# Patient Record
Sex: Male | Born: 1995 | Race: Black or African American | Hispanic: No | Marital: Single | Smoking: Current every day smoker
Health system: Southern US, Community
[De-identification: ages and names within clinical notes are randomized; demographics above are authoritative.]

---
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
## 2014-10-26 ENCOUNTER — Encounter (HOSPITAL_COMMUNITY): Payer: Self-pay | Admitting: *Deleted

## 2014-10-26 ENCOUNTER — Emergency Department (HOSPITAL_COMMUNITY)
Admission: EM | Admit: 2014-10-26 | Discharge: 2014-10-26 | Disposition: A | Discharge status: Home / Self Care | Payer: Medicaid Other | Attending: Emergency Medicine | Admitting: Emergency Medicine

## 2014-10-26 DIAGNOSIS — R300 Dysuria: Secondary | ICD-10-CM | POA: Diagnosis present

## 2014-10-26 DIAGNOSIS — N342 Other urethritis: Secondary | ICD-10-CM | POA: Insufficient documentation

## 2014-10-26 DIAGNOSIS — Z720 Tobacco use: Secondary | ICD-10-CM | POA: Insufficient documentation

## 2014-10-26 LAB — URINALYSIS, ROUTINE W REFLEX MICROSCOPIC
Bilirubin Urine: NEGATIVE
Glucose, UA: 100 mg/dL — AB
HGB URINE DIPSTICK: NEGATIVE
KETONES UR: NEGATIVE mg/dL
Leukocytes, UA: NEGATIVE
Nitrite: POSITIVE — AB
Protein, ur: 30 mg/dL — AB
Specific Gravity, Urine: 1.02 (ref 1.005–1.030)
Urobilinogen, UA: 2 mg/dL — ABNORMAL HIGH (ref 0.0–1.0)
pH: 6.5 (ref 5.0–8.0)

## 2014-10-26 LAB — URINE MICROSCOPIC-ADD ON

## 2014-10-26 MED ORDER — CEFTRIAXONE SODIUM 250 MG IJ SOLR
250.0000 mg | Freq: Once | INTRAMUSCULAR | Status: AC
  Administered 2014-10-26: 250 mg via INTRAMUSCULAR
  Filled 2014-10-26: qty 250

## 2014-10-26 MED ORDER — IBUPROFEN 800 MG PO TABS
800.0000 mg | ORAL_TABLET | Freq: Once | ORAL | Status: AC
  Administered 2014-10-26: 800 mg via ORAL
  Filled 2014-10-26: qty 1

## 2014-10-26 MED ORDER — LIDOCAINE HCL (PF) 1 % IJ SOLN
INTRAMUSCULAR | Status: AC
  Administered 2014-10-26: 5 mL
  Filled 2014-10-26: qty 5

## 2014-10-26 MED ORDER — AZITHROMYCIN 250 MG PO TABS
1000.0000 mg | ORAL_TABLET | Freq: Once | ORAL | Status: AC
  Administered 2014-10-26: 1000 mg via ORAL
  Filled 2014-10-26: qty 4

## 2014-10-26 MED ORDER — AZITHROMYCIN 1 G PO PACK: 1.0000 g | PACK | Freq: Once | ORAL | Status: DC

## 2014-10-26 NOTE — Discharge Instructions (Signed)
He had a culture sent for gonorrhea and chlamydia. He will be contacted if it is positive.  Urethritis Urethritis is an inflammation of the tube through which urine exits your bladder (urethra).  CAUSES Urethritis is often caused by an infection in your urethra. The infection can be viral, like herpes. The infection can also be bacterial, like gonorrhea. RISK FACTORS Risk factors of urethritis include:  Having sex without using a condom.  Having multiple sexual partners.  Having poor hygiene. SIGNS AND SYMPTOMS Symptoms of urethritis are less noticeable in women than in men. These symptoms include:  Burning feeling when you urinate (dysuria).  Discharge from your urethra.  Blood in your urine (hematuria).  Urinating more than usual. DIAGNOSIS  To confirm a diagnosis of urethritis, your health care provider will do the following:  Ask about your sexual history.  Perform a physical exam.  Have you provide a sample of your urine for lab testing.  Use a cotton swab to gently collect a sample from your urethra for lab testing. TREATMENT  It is important to treat urethritis. Depending on the cause, untreated urethritis may lead to serious genital infections and possibly infertility. Urethritis caused by a bacterial infection is treated with antibiotic medicine. All sexual partners must be treated.  HOME CARE INSTRUCTIONS  Do not have sex until the test results are known and treatment is completed, even if your symptoms go away before you finish treatment.  If you were prescribed an antibiotic, finish it all even if you start to feel better. SEEK MEDICAL CARE IF:   Your symptoms are not improved in 3 days.  Your symptoms are getting worse.  You develop abdominal pain or pelvic pain (in women).  You develop joint pain.  You have a fever. SEEK IMMEDIATE MEDICAL CARE IF:   You have severe pain in the belly, back, or side.  You have repeated vomiting. MAKE SURE  YOU:  Understand these instructions.  Will watch your condition.  Will get help right away if you are not doing well or get worse. Document Released: 03/02/2001 Document Revised: 01/21/2014 Document Reviewed: 05/07/2013 [employer] Patient Information 2015 [place] [place]. This information is not intended to replace advice given to you by your health care provider. Make sure you discuss any questions you have with your health care provider.

## 2014-10-26 NOTE — ED Provider Notes (Signed)
CSN: [id number]     Arrival date & time 10/26/14  0132 History   First MD Initiated Contact with Patient 10/26/14 0230     Chief Complaint  Patient presents with  . Dysuria     (Consider location/radiation/quality/duration/timing/severity/associated sxs/prior Treatment) Patient is a 19 y.o. male presenting with dysuria. The history is provided by the patient.  Dysuria  He has noticed burning when he urinates for the last 2 days. There is no urinary urgency, frequency, tenesmus. He denies any unprotected sexual contacts. He denies any urethral discharge. He denies abdominal pain and back pain. There's been no fever or chills and no nausea or vomiting.  History reviewed. No pertinent past medical history. History reviewed. No pertinent past surgical history. History reviewed. No pertinent family history. History  Substance Use Topics  . Smoking status: Current Every Day Smoker -- 1.00 packs/day    Types: Cigarettes  . Smokeless tobacco: Not on file  . Alcohol Use: Yes    Review of Systems  Genitourinary: Positive for dysuria.  All other systems reviewed and are negative.     Allergies  Review of patient's allergies indicates no known allergies.  Home Medications   Prior to Admission medications   Not on File   BP 156/101 mmHg  Pulse 79  Temp(Src) 98.9 F (37.2 C)  Resp 20  Ht 6' (1.829 m)  Wt 170 lb (77.111 kg)  BMI 23.05 kg/m2  SpO2 98% Physical Exam  Nursing note and vitals reviewed.  19 year old male, resting comfortably and in no acute distress. Vital signs are significant for hypertension. Oxygen saturation is 98%, which is normal. Head is normocephalic and atraumatic. PERRLA, EOMI. Oropharynx is clear. Neck is nontender and supple without adenopathy or JVD. Back is nontender and there is no CVA tenderness. Lungs are clear without rales, wheezes, or rhonchi. Chest is nontender. Heart has regular rate and rhythm without murmur. Abdomen is soft, flat,  nontender without masses or hepatosplenomegaly and peristalsis is normoactive. Genitalia: Circumcised penis without urethral discharge. Testes descended without masses. Mild bilateral inguinal adenopathy present. Extremities have no cyanosis or edema, full range of motion is present. Skin is warm and dry without rash. Neurologic: Mental status is normal, cranial nerves are intact, there are no motor or sensory deficits.  ED Course  Procedures (including critical care time) Labs Review Results for orders placed or performed during the hospital encounter of 10/26/14  Urinalysis, Routine w reflex microscopic  Result Value Ref Range   Color, Urine ORANGE (A) YELLOW   APPearance CLEAR CLEAR   Specific Gravity, Urine 1.020 1.005 - 1.030   pH 6.5 5.0 - 8.0   Glucose, UA 100 (A) NEGATIVE mg/dL   Hgb urine dipstick NEGATIVE NEGATIVE   Bilirubin Urine NEGATIVE NEGATIVE   Ketones, ur NEGATIVE NEGATIVE mg/dL   Protein, ur 30 (A) NEGATIVE mg/dL   Urobilinogen, UA 2.0 (H) 0.0 - 1.0 mg/dL   Nitrite POSITIVE (A) NEGATIVE   Leukocytes, UA NEGATIVE NEGATIVE  Urine microscopic-add on  Result Value Ref Range   Squamous Epithelial / LPF RARE RARE   WBC, UA 0-2 <3 WBC/hpf   RBC / HPF 7-10 <3 RBC/hpf   Bacteria, UA RARE RARE     MDM   Final diagnoses:  Urethritis    Urethritis which rapidly represents gonorrhea or chlamydia. He is empirically treated with ceftriaxone and azithromycin. Specimen is been sent for GC and Chlamydia culture. Please note that positive nitrite in his urinalysis is a false-positive due  to color interference from Azo that he is taking.    Dione Booze, MD 10/26/14 [phone]

## 2014-10-26 NOTE — ED Notes (Signed)
Pt reporting burning with urination.  Denies discharge.

## 2014-10-28 LAB — GC/CHLAMYDIA PROBE AMP (~~LOC~~) NOT AT ARMC
Chlamydia: NEGATIVE
Neisseria Gonorrhea: NEGATIVE

## 2014-10-30 ENCOUNTER — Telehealth (HOSPITAL_BASED_OUTPATIENT_CLINIC_OR_DEPARTMENT_OTHER): Payer: Self-pay | Admitting: Emergency Medicine

## 2016-10-31 ENCOUNTER — Encounter (HOSPITAL_COMMUNITY): Payer: Self-pay | Admitting: *Deleted

## 2017-01-10 ENCOUNTER — Ambulatory Visit: Payer: Self-pay | Admitting: Family Medicine

## 2018-09-07 ENCOUNTER — Other Ambulatory Visit: Payer: Self-pay

## 2018-09-07 ENCOUNTER — Encounter (HOSPITAL_COMMUNITY): Payer: Self-pay | Admitting: *Deleted

## 2018-09-07 ENCOUNTER — Emergency Department (HOSPITAL_COMMUNITY)
Admission: EM | Admit: 2018-09-07 | Discharge: 2018-09-08 | Disposition: A | Discharge status: Home / Self Care | Payer: Medicaid Other | Attending: Emergency Medicine | Admitting: Emergency Medicine

## 2018-09-07 DIAGNOSIS — I10 Essential (primary) hypertension: Secondary | ICD-10-CM

## 2018-09-07 DIAGNOSIS — F17210 Nicotine dependence, cigarettes, uncomplicated: Secondary | ICD-10-CM | POA: Insufficient documentation

## 2018-09-07 DIAGNOSIS — Z79899 Other long term (current) drug therapy: Secondary | ICD-10-CM | POA: Diagnosis not present

## 2018-09-07 DIAGNOSIS — R030 Elevated blood-pressure reading, without diagnosis of hypertension: Secondary | ICD-10-CM | POA: Diagnosis not present

## 2018-09-07 DIAGNOSIS — R42 Dizziness and giddiness: Secondary | ICD-10-CM | POA: Diagnosis present

## 2018-09-07 MED ORDER — MECLIZINE HCL 12.5 MG PO TABS
25.0000 mg | ORAL_TABLET | Freq: Once | ORAL | Status: AC
  Administered 2018-09-07: 25 mg via ORAL
  Filled 2018-09-07: qty 2

## 2018-09-07 MED ORDER — SODIUM CHLORIDE 0.9 % IV BOLUS
1000.0000 mL | Freq: Once | INTRAVENOUS | Status: AC
  Administered 2018-09-07: 1000 mL via INTRAVENOUS

## 2018-09-07 NOTE — ED Triage Notes (Signed)
Pt c/o dizziness that started today, chills for the past few days, denies any n/v

## 2018-09-08 LAB — BASIC METABOLIC PANEL
Anion gap: 10 (ref 5–15)
BUN: 15 mg/dL (ref 6–20)
CHLORIDE: 103 mmol/L (ref 98–111)
CO2: 24 mmol/L (ref 22–32)
Calcium: 9.1 mg/dL (ref 8.9–10.3)
Creatinine, Ser: 0.79 mg/dL (ref 0.61–1.24)
GFR calc Af Amer: >60 mL/min (ref >60)
GFR calc non Af Amer: >60 mL/min (ref >60)
Glucose, Bld: 112 mg/dL — ABNORMAL HIGH (ref 70–99)
Potassium: 3.4 mmol/L — ABNORMAL LOW (ref 3.5–5.1)
SODIUM: 137 mmol/L (ref 135–145)

## 2018-09-08 LAB — CBC WITH DIFFERENTIAL/PLATELET
Abs Immature Granulocytes: 0.11 10*3/uL — ABNORMAL HIGH (ref 0.00–0.07)
Basophils Absolute: 0 10*3/uL (ref 0.0–0.1)
Basophils Relative: 0 %
Eosinophils Absolute: 0.1 10*3/uL (ref 0.0–0.5)
Eosinophils Relative: 1 %
HEMATOCRIT: 42.6 % (ref 39.0–52.0)
HEMOGLOBIN: 14.2 g/dL (ref 13.0–17.0)
Immature Granulocytes: 1 %
LYMPHS PCT: 23 %
Lymphs Abs: 2.4 10*3/uL (ref 0.7–4.0)
MCH: 30.7 pg (ref 26.0–34.0)
MCHC: 33.3 g/dL (ref 30.0–36.0)
MCV: 92.2 fL (ref 80.0–100.0)
Monocytes Absolute: 0.8 10*3/uL (ref 0.1–1.0)
Monocytes Relative: 7 %
Neutro Abs: 7.1 10*3/uL (ref 1.7–7.7)
Neutrophils Relative %: 68 %
Platelets: 399 10*3/uL (ref 150–400)
RBC: 4.62 MIL/uL (ref 4.22–5.81)
RDW: 13.1 % (ref 11.5–15.5)
WBC: 10.5 10*3/uL (ref 4.0–10.5)
nRBC: 0 % (ref 0.0–0.2)

## 2018-09-08 LAB — URINALYSIS, ROUTINE W REFLEX MICROSCOPIC
BILIRUBIN URINE: NEGATIVE
Glucose, UA: NEGATIVE mg/dL
HGB URINE DIPSTICK: NEGATIVE
Ketones, ur: NEGATIVE mg/dL
Leukocytes, UA: NEGATIVE
Nitrite: NEGATIVE
PH: 6 (ref 5.0–8.0)
Protein, ur: NEGATIVE mg/dL
SPECIFIC GRAVITY, URINE: 1.019 (ref 1.005–1.030)

## 2018-09-08 MED ORDER — MECLIZINE HCL 25 MG PO TABS: 25.0000 mg | ORAL_TABLET | Freq: Three times a day (TID) | ORAL | 0 refills | Status: DC | PRN

## 2018-09-08 NOTE — ED Provider Notes (Signed)
[facility] Provider Note   CSN: [id number] Arrival date & time: 09/07/18  2246     History   Chief Complaint Chief Complaint  Patient presents with  . Dizziness    HPI Bradley Wilcox is a 22 y.o. male.  HPI   Bradley Wilcox is a 22 y.o. male who presents to the Emergency Department complaining of sinus pressure, nasal congestion and occasional cough.  Symptoms began 1 week ago and seem to be improving.  Today, he states he has noticed dizziness upon standing and intermittent sweats and chills for several days.  He denies known fever.  He has not taken any medication for symptomatic relief.  He states the dizziness only occurs with sudden position change.  He denies vomiting, nausea, chest pain, shortness of breath and headache.  No new medications and he denies drug use   History reviewed. No pertinent past medical history.  Patient Active Problem List   Diagnosis Date Noted  . Acute respiratory failure with hypoxia (HCC) 03/25/2013  . Alcohol intoxication (HCC) 03/25/2013  . Reactive airway disease 03/25/2013    History reviewed. No pertinent surgical history.    Home Medications    Prior to Admission medications   Medication Sig Start Date End Date Taking? Authorizing Provider  lisdexamfetamine (VYVANSE) 20 MG capsule Take 20 mg by mouth every morning.    [provider]    Family History Family History  Family history unknown: Yes    Social History Social History   Tobacco Use  . Smoking status: Current Every Day Smoker    Packs/day: 1.00    Types: Cigarettes  . Smokeless tobacco: Never Used  Substance Use Topics  . Alcohol use: Yes  . Drug use: Never     Allergies   Patient has no known allergies.   Review of Systems Review of Systems  Constitutional: Positive for chills. Negative for activity change and appetite change.  HENT: Positive for congestion, sinus pressure and sinus pain. Negative for sore throat and  trouble swallowing.   Respiratory: Positive for cough. Negative for chest tightness and shortness of breath.   Cardiovascular: Negative for chest pain and palpitations.  Gastrointestinal: Negative for abdominal pain, diarrhea, nausea and vomiting.  Genitourinary: Negative for dysuria.  Musculoskeletal: Negative for arthralgias, myalgias, neck pain and neck stiffness.  Skin: Negative for rash.  Neurological: Positive for dizziness. Negative for syncope, facial asymmetry, weakness, numbness and headaches.  Psychiatric/Behavioral: Negative for confusion.     Physical Exam Updated Vital Signs BP (!) 176/97   Pulse (!) 108   Temp 98.5 F (36.9 C) (Oral)   Resp 20   Ht 6' (1.829 m)   Wt 104.3 kg   SpO2 98%   BMI 31.19 kg/m   Physical Exam Vitals signs and nursing note reviewed.  Constitutional:      Appearance: He is not ill-appearing or toxic-appearing.  HENT:     Head: Atraumatic.     Right Ear: Tympanic membrane and ear canal normal. No middle ear effusion.     Left Ear: Tympanic membrane and ear canal normal.  No middle ear effusion.     Mouth/Throat:     Mouth: Mucous membranes are moist.     Pharynx: Oropharynx is clear. No oropharyngeal exudate.  Eyes:     Conjunctiva/sclera: Conjunctivae normal.     Pupils: Pupils are equal, round, and reactive to light.  Neck:     Musculoskeletal: Normal range of motion. No neck rigidity.  Trachea: Phonation normal.     Meningeal: Kernig's sign absent.  Cardiovascular:     Rate and Rhythm: Normal rate and regular rhythm.     Pulses: Normal pulses.  Pulmonary:     Effort: Pulmonary effort is normal. No respiratory distress.     Breath sounds: Normal breath sounds.  Abdominal:     Palpations: Abdomen is soft.     Tenderness: There is no abdominal tenderness.  Musculoskeletal: Normal range of motion.  Skin:    General: Skin is warm.     Capillary Refill: Capillary refill takes less than 2 seconds.     Findings: No rash.    Neurological:     General: No focal deficit present.     Mental Status: He is alert.     GCS: GCS eye subscore is 4. GCS verbal subscore is 5. GCS motor subscore is 6.     Sensory: Sensation is intact. No sensory deficit.     Motor: Motor function is intact.     Coordination: Finger-Nose-Finger Test and Heel to [place] Test normal.     Comments: CN II-XII intact.  Speech clear, no pronator drift.   Psychiatric:        Mood and Affect: Mood normal.      ED Treatments / Results  Labs (all labs ordered are listed, but only abnormal results are displayed) Labs Reviewed  CBC WITH DIFFERENTIAL/PLATELET - Abnormal; Notable for the following components:      Result Value   Abs Immature Granulocytes 0.11 (*)    All other components within normal limits  BASIC METABOLIC PANEL  URINALYSIS, ROUTINE W REFLEX MICROSCOPIC    EKG None  Radiology No results found.  Procedures Procedures (including critical care time)  Medications Ordered in ED Medications  sodium chloride 0.9 % bolus 1,000 mL (1,000 mLs Intravenous New Bag/Given 09/07/18 2357)  meclizine (ANTIVERT) tablet 25 mg (25 mg Oral Given 09/07/18 2357)     Initial Impression / Assessment and Plan / ED Course  I have reviewed the triage vital signs and the nursing notes.  Pertinent labs & imaging results that were available during my care of the patient were reviewed by me and considered in my medical decision making (see chart for details).      Orthostatic VS for the past 24 hrs:  BP- Lying Pulse- Lying BP- Sitting Pulse- Sitting BP- Standing at 0 minutes Pulse- Standing at 0 minutes  09/08/18 0002 143/89 86 (!) 156/102 93 (!) 144/94 93      Patient is well-appearing.  Patient is hypertensive without known history.  No focal neuro deficits on exam, no headache or nuchal rigidity. Onset of dizziness after sinus pressure and nasal congestion.  Current symptoms felt to be related to this, although I have discussed the  issue of his elevated blood pressure and the importance of follow-up.  He agrees to treatment plan of his symptoms and close outpatient follow-up for his blood pressure.  Return precautions discussed.  Final Clinical Impressions(s) / ED Diagnoses   Final diagnoses:  Dizziness  Hypertension, unspecified type    ED Discharge Orders    None       Pauline Ausriplett, Vito Beg, PA-C 09/08/18 0126    Devoria AlbeKnapp, Iva, MD 09/08/18 [phone]

## 2018-09-08 NOTE — Discharge Instructions (Addendum)
As discussed, your blood pressure this evening is elevated.  This will need to be rechecked soon.  You may contact the clinic listed to establish care and to have your blood pressure rechecked.  Return to the emergency room for any worsening symptoms such as chest pain shortness of breath, or severe headache.  [facility] Doctor List    Kari BaarsEdward Hawkins MD. Specialty: Pulmonary Disease Contact information: [address]  [address]  [place] [address]  [phone]   Syliva OvermanMargaret Simpson, MD. Specialty: [facility] Contact information: [address]  [place] [address]  [phone]   Lilyan PuntScott Luking, MD. Specialty: [facility] Contact information: [address]  [place] [address]  [phone]   Avon Gullyesfaye Fanta, MD Specialty: Internal Medicine Contact information: [address]  [place] [address]  [phone]   Catalina PizzaZach Hall, MD. Specialty: Internal Medicine Contact information: [address]  [place] [address]  [phone]    [facility] (Dr. Selena BattenKim) Specialty: Family Medicine Contact information: [address]  [place] [address]  [phone]   John GiovanniStephen Knowlton, MD. Specialty: [facility] Contact information: [address]  [address]  [place] [address]  [phone]   Carylon Perchesoy Fagan, MD. Specialty: Internal Medicine Contact information: [address]  [address]  [place] [address]  [phone]    [facility]  [address] [place], [place] [address] [phone]  Services [facility] offers a variety of basic health services.  Services include but are not limited to: Blood pressure checks  Heart rate checks  Blood sugar checks  Urine analysis  Rapid strep tests  Pregnancy tests.  Health education and referrals  People needing more complex services will be directed to a physician online. Using  these virtual visits, doctors can evaluate and prescribe medicine and treatments. There will be no medication on-site, though [facility] will help patients fill their prescriptions at little to no cost.   For More information please go to: [email or web address]

## 2019-02-21 ENCOUNTER — Encounter (HOSPITAL_COMMUNITY): Payer: Self-pay | Admitting: Emergency Medicine

## 2019-02-21 ENCOUNTER — Emergency Department (HOSPITAL_COMMUNITY)
Admission: EM | Admit: 2019-02-21 | Discharge: 2019-02-21 | Disposition: A | Discharge status: Home / Self Care | Payer: Medicaid Other | Attending: Emergency Medicine | Admitting: Emergency Medicine

## 2019-02-21 ENCOUNTER — Other Ambulatory Visit: Payer: Self-pay

## 2019-02-21 DIAGNOSIS — R1310 Dysphagia, unspecified: Secondary | ICD-10-CM | POA: Insufficient documentation

## 2019-02-21 DIAGNOSIS — F17210 Nicotine dependence, cigarettes, uncomplicated: Secondary | ICD-10-CM | POA: Insufficient documentation

## 2019-02-21 LAB — BASIC METABOLIC PANEL
Anion gap: 13 (ref 5–15)
BUN: 13 mg/dL (ref 6–20)
CO2: 24 mmol/L (ref 22–32)
Calcium: 9.2 mg/dL (ref 8.9–10.3)
Chloride: 102 mmol/L (ref 98–111)
Creatinine, Ser: 1 mg/dL (ref 0.61–1.24)
GFR calc Af Amer: >60 mL/min (ref >60)
GFR calc non Af Amer: >60 mL/min (ref >60)
Glucose, Bld: 117 mg/dL — ABNORMAL HIGH (ref 70–99)
Potassium: 3.6 mmol/L (ref 3.5–5.1)
Sodium: 139 mmol/L (ref 135–145)

## 2019-02-21 LAB — CBC
HCT: 44.4 % (ref 39.0–52.0)
Hemoglobin: 14.8 g/dL (ref 13.0–17.0)
MCH: 30.1 pg (ref 26.0–34.0)
MCHC: 33.3 g/dL (ref 30.0–36.0)
MCV: 90.4 fL (ref 80.0–100.0)
Platelets: 375 10*3/uL (ref 150–400)
RBC: 4.91 MIL/uL (ref 4.22–5.81)
RDW: 13.8 % (ref 11.5–15.5)
WBC: 6 10*3/uL (ref 4.0–10.5)
nRBC: 0 % (ref 0.0–0.2)

## 2019-02-21 MED ORDER — SODIUM CHLORIDE 0.9 % IV BOLUS
1000.0000 mL | Freq: Once | INTRAVENOUS | Status: AC
  Administered 2019-02-21: 1000 mL via INTRAVENOUS

## 2019-02-21 MED ORDER — LIDOCAINE VISCOUS HCL 2 % MT SOLN
15.0000 mL | Freq: Once | OROMUCOSAL | Status: AC
Start: 2019-02-21 — End: 2019-02-21
  Administered 2019-02-21: 15 mL via ORAL
  Filled 2019-02-21: qty 15

## 2019-02-21 MED ORDER — DIPHENHYDRAMINE HCL 50 MG/ML IJ SOLN
25.0000 mg | Freq: Once | INTRAMUSCULAR | Status: AC
  Administered 2019-02-21: 25 mg via INTRAVENOUS
  Filled 2019-02-21: qty 1

## 2019-02-21 MED ORDER — PANTOPRAZOLE SODIUM 20 MG PO TBEC: 20.0000 mg | DELAYED_RELEASE_TABLET | Freq: Every day | ORAL | 0 refills | Status: DC

## 2019-02-21 MED ORDER — FAMOTIDINE IN NACL 20-0.9 MG/50ML-% IV SOLN
20.0000 mg | Freq: Once | INTRAVENOUS | Status: AC
  Administered 2019-02-21: 20 mg via INTRAVENOUS
  Filled 2019-02-21: qty 50

## 2019-02-21 MED ORDER — ALUM & MAG HYDROXIDE-SIMETH 200-200-20 MG/5ML PO SUSP
30.0000 mL | Freq: Once | ORAL | Status: AC
  Administered 2019-02-21: 21:00:00 30 mL via ORAL
  Filled 2019-02-21: qty 30

## 2019-02-21 NOTE — ED Provider Notes (Signed)
[facility] Provider Note   CSN: [id number] Arrival date & time: 02/21/19  1830    History   Chief Complaint Chief Complaint  Patient presents with  . Dysphagia    HPI Bradley Wilcox is a 23 y.o. male presenting for evaluation of dysphagia.  Patient states approximately 1 hour prior to arrival he had acute onset feeling like his mouth and jaw were looking up and he was having difficulty swallowing.  He denies difficulty breathing, nausea, vomiting, chest pain, shortness of breath, pruritus, or rash.  He denies recent fevers, chills, sore throat, or dental pain.  He states he ate a sausage biscuit around noon today, has had nothing else to eat or drink.  He has not taken any medication today, he has not recently been started on any medicine.  He denies new exposures including soaps, shampoos, facial wash. He denies new exposures at work.  He denies recent fevers, chills, cough, abdominal pain.  Patient states he feels slightly dizzy when going from sitting to standing, had some neck similar happen several months ago resolved without intervention.  No dizziness at rest or currently.      HPI  History reviewed. No pertinent past medical history.  Patient Active Problem List   Diagnosis Date Noted  . Acute respiratory failure with hypoxia (HCC) 03/25/2013  . Alcohol intoxication (HCC) 03/25/2013  . Reactive airway disease 03/25/2013    History reviewed. No pertinent surgical history.      Home Medications    Prior to Admission medications   Medication Sig Start Date End Date Taking? Authorizing Provider  pantoprazole (PROTONIX) 20 MG tablet Take 1 tablet (20 mg total) by mouth daily for 14 days. 02/21/19 03/07/19  Yvan Dority, PA-C    Family History Family History  Family history unknown: Yes    Social History Social History   Tobacco Use  . Smoking status: Current Every Day Smoker    Packs/day: 1.00    Types: Cigarettes  . Smokeless  tobacco: Never Used  Substance Use Topics  . Alcohol use: Yes  . Drug use: Never     Allergies   Patient has no known allergies.   Review of Systems Review of Systems  HENT:       Dysphagia  Neurological: Positive for dizziness (intermittent).  All other systems reviewed and are negative.    Physical Exam Updated Vital Signs BP (!) 132/94   Pulse 92   Temp 99.9 F (37.7 C) (Oral)   Resp 18   Ht 6' (1.829 m)   Wt 99.8 kg   SpO2 99%   BMI 29.84 kg/m   Physical Exam Vitals signs and nursing note reviewed.  Constitutional:      General: He is not in acute distress.    Appearance: He is well-developed.  HENT:     Head: Normocephalic and atraumatic.     Comments: No obvious facial swelling, although patient has a large beard making evaluation difficult.  No obvious trismus or dental pain.  No lip, tongue, or throat swelling.  Handling secretions easily.  Airway intact. No pain under the tongue.  No muffled voice.  Uvula midline with equal palate rise.  No tonsillar swelling or erythema. Eyes:     Extraocular Movements: Extraocular movements intact.     Conjunctiva/sclera: Conjunctivae normal.     Pupils: Pupils are equal, round, and reactive to light.  Neck:     Musculoskeletal: Normal range of motion and neck supple.  Cardiovascular:  Rate and Rhythm: Normal rate and regular rhythm.     Pulses: Normal pulses.  Pulmonary:     Effort: Pulmonary effort is normal. No respiratory distress.     Breath sounds: Normal breath sounds. No wheezing.     Comments: Speaking in full sentences, clear lung sounds in all fields.  Abdominal:     General: Bowel sounds are normal. There is no distension.     Palpations: Abdomen is soft.     Tenderness: There is no abdominal tenderness.  Musculoskeletal: Normal range of motion.  Skin:    General: Skin is warm and dry.  Neurological:     Mental Status: He is alert and oriented to person, place, and time.      ED Treatments /  Results  Labs (all labs ordered are listed, but only abnormal results are displayed) Labs Reviewed  BASIC METABOLIC PANEL - Abnormal; Notable for the following components:      Result Value   Glucose, Bld 117 (*)    All other components within normal limits  CBC    EKG None  Radiology No results found.  Procedures Procedures (including critical care time)  Medications Ordered in ED Medications  diphenhydrAMINE (BENADRYL) injection 25 mg (25 mg Intravenous Given 02/21/19 1912)  famotidine (PEPCID) IVPB 20 mg premix (0 mg Intravenous Stopped 02/21/19 1959)  sodium chloride 0.9 % bolus 1,000 mL (0 mLs Intravenous Stopped 02/21/19 2051)  alum & mag hydroxide-simeth (MAALOX/MYLANTA) 200-200-20 MG/5ML suspension 30 mL (30 mLs Oral Given 02/21/19 2052)    And  lidocaine (XYLOCAINE) 2 % viscous mouth solution 15 mL (15 mLs Oral Given 02/21/19 2052)     Initial Impression / Assessment and Plan / ED Course  I have reviewed the triage vital signs and the nursing notes.  Pertinent labs & imaging results that were available during my care of the patient were reviewed by me and considered in my medical decision making (see chart for details).        Patient resenting for evaluation of dysphagia.  Physical exam reassuring, he appears nontoxic.  Airways intact.  No signs of anaphylaxis or respiratory distress.  As patient reports a locking sensation, consider possible dystonic reaction, although no known trigger.  As such, will give IV Benadryl.  Will give Pepcid for possible GERD causing dysphagia.  On reassessment, patient report symptoms are improved with Benadryl and Pepcid.  Drinking water without difficulty.  Patient reports continued dizziness when going from sitting to standing, will order orthostatics, basic labs, give a bolus of fluids and reassess.  Orthostatics show elevation of heart rate will go from sitting to standing, but blood pressure remained stable.  RN reports patient reports  increased dysphagia upon standing, but no significant dizziness. Discussed with attending, Dr. Jacqulyn Bath agrees to plan.  As symptoms resolve when patient lays down, low suspicion for emergent cause for patient's dysphasia such as mass, infection, abscess, anaphylaxis.  Likely GERD.  Symptoms improved with viscous lidocaine.  Discussed with patient.  Discussed treatment with daily PPI and follow-up with either primary care and/or GI.  At this time, patient appears safe for discharge.  Return precautions given.  Patient states he understands agrees with plan.   Final Clinical Impressions(s) / ED Diagnoses   Final diagnoses:  Dysphagia, unspecified type    ED Discharge Orders         Ordered    pantoprazole (PROTONIX) 20 MG tablet  Daily     02/21/19 2115  Alveria Apley, PA-C 02/21/19 2210    Maia Plan, MD 02/22/19 1233

## 2019-02-21 NOTE — Discharge Instructions (Signed)
Take Protonix daily. If you are feeling something stuck in throat, you may try Tums or Maalox. Follow-up with your primary care doctor.  There is information listed below, as well as in the paperwork. You may also schedule appointment with GI stomach doctors for further evaluation. Make sure you stay well-hydrated water. Return to the emergency room if you develop difficulty breathing, swelling of your lips/tongue/throat, inability to swallow, or any new, worsening, concerning symptoms.

## 2019-02-21 NOTE — ED Notes (Signed)
Patient states that upon standing he felt weak and dizzy at this time, states also that his throat feels like it is swelling again.

## 2019-02-21 NOTE — ED Triage Notes (Signed)
Started having difficulty swallowing about one hour ago.  C/o dizziness denies pain.  Pt reports eating a sausage biscuit at 1200.

## 2019-02-22 ENCOUNTER — Encounter (HOSPITAL_COMMUNITY): Payer: Self-pay | Admitting: *Deleted

## 2019-02-22 ENCOUNTER — Emergency Department (HOSPITAL_COMMUNITY)
Admission: EM | Admit: 2019-02-22 | Discharge: 2019-02-22 | Disposition: A | Discharge status: Home / Self Care | Payer: Medicaid Other | Attending: Emergency Medicine | Admitting: Emergency Medicine

## 2019-02-22 DIAGNOSIS — R1310 Dysphagia, unspecified: Secondary | ICD-10-CM | POA: Diagnosis present

## 2019-02-22 DIAGNOSIS — R070 Pain in throat: Secondary | ICD-10-CM | POA: Insufficient documentation

## 2019-02-22 DIAGNOSIS — F17210 Nicotine dependence, cigarettes, uncomplicated: Secondary | ICD-10-CM | POA: Insufficient documentation

## 2019-02-22 MED ORDER — LIDOCAINE VISCOUS HCL 2 % MT SOLN
15.0000 mL | Freq: Once | OROMUCOSAL | Status: AC
  Administered 2019-02-22: 15 mL via ORAL
  Filled 2019-02-22: qty 15

## 2019-02-22 MED ORDER — PANTOPRAZOLE SODIUM 20 MG PO TBEC: 20.0000 mg | DELAYED_RELEASE_TABLET | Freq: Once | ORAL | Status: DC

## 2019-02-22 MED ORDER — ALUM & MAG HYDROXIDE-SIMETH 200-200-20 MG/5ML PO SUSP
30.0000 mL | Freq: Once | ORAL | Status: AC
  Administered 2019-02-22: 30 mL via ORAL
  Filled 2019-02-22: qty 30

## 2019-02-22 NOTE — ED Triage Notes (Signed)
Pt in c/o difficulty swallowing and intermittent throat pain since last night, denies cough or fever, reports its intermittent and his throat will feel tight, no distress noted

## 2019-02-22 NOTE — Discharge Instructions (Addendum)
Take the Protonix that was prescribed to you once daily at least 30 minutes before breakfast every day, for acute symptoms you can use Maalox and Chloraseptic throat spray or cervical throat lozenges to help with the odd sensation in your throat.  If the symptoms continue you will need to follow-up with your primary care doctor or a GI doctor for further evaluation.  If you develop fevers, facial swelling or swelling in your neck, high-pitched noise when he breathes in and out, difficulty breathing or swallowing where you are unable to tolerate your saliva or swallow food and drink please return to the emergency department.  COVID Testing: [phone], call this number for the [facility] department for prescreening and potentially schedule appointment for COVID testing

## 2019-02-22 NOTE — ED Provider Notes (Signed)
[facility] EMERGENCY DEPARTMENT Provider Note   CSN: [id number] Arrival date & time: 02/22/19  1321    History   Chief Complaint Chief Complaint  Patient presents with  . Sore Throat    HPI Bradley Wilcox is a 23 y.o. male.     Bradley Wilcox is a 23 y.o. male who presents for evaluation of dysphagia. Patient reports symptoms started yesterday, patient was initially evaluated at [facility] emergency department 1 hour after symptoms started, he had reassuring evaluation and was discharged home PPI, felt that GERD may be causing symptoms.  Patient reports that he feels like his jaw and mouth get tight suddenly and has difficulty swallowing.  He reports he is unsure what triggers the symptoms, and it hurt for the first time yesterday.  No reports his throat does not feel sore but it almost feels swollen the back occultly bleeding.  He has not had any fevers, chills or cough.  He has been able to eat food and swallow successfully yesterday and today.  He has not had any nausea or vomiting he denies abdominal pain, chest pain or shortness of breath.  He was prescribed Protonix yesterday after he was given a GI with improvement in the emergency department, he reports he has not started taking this medication yet as he felt that it would not work given problem seems to be in his throat and this medicine is for stomach.     History reviewed. No pertinent past medical history.  Patient Active Problem List   Diagnosis Date Noted  . Acute respiratory failure with hypoxia (HCC) 03/25/2013  . Alcohol intoxication (HCC) 03/25/2013  . Reactive airway disease 03/25/2013    History reviewed. No pertinent surgical history.      Home Medications    Prior to Admission medications   Medication Sig Start Date End Date Taking? Authorizing Provider  pantoprazole (PROTONIX) 20 MG tablet Take 1 tablet (20 mg total) by mouth daily for 14 days. 02/21/19 03/07/19  Caccavale, Sophia,  PA-C    Family History Family History  Family history unknown: Yes    Social History Social History   Tobacco Use  . Smoking status: Current Every Day Smoker    Packs/day: 1.00    Types: Cigarettes  . Smokeless tobacco: Never Used  Substance Use Topics  . Alcohol use: Yes  . Drug use: Never     Allergies   Patient has no known allergies.   Review of Systems Review of Systems  Constitutional: Negative for chills and fever.  HENT: Positive for sore throat and trouble swallowing. Negative for facial swelling.   Eyes: Negative for visual disturbance.  Respiratory: Negative for cough and shortness of breath.   Cardiovascular: Negative for chest pain.  Gastrointestinal: Negative for abdominal pain, nausea and vomiting.  Musculoskeletal: Negative for neck pain and neck stiffness.  Skin: Negative for rash.  Neurological: Negative for headaches.     Physical Exam Updated Vital Signs BP (!) 172/116   Pulse 100   Temp 98.1 F (36.7 C) (Oral)   Resp 16   SpO2 100%   Physical Exam Vitals signs and nursing note reviewed.  Constitutional:      General: He is not in acute distress.    Appearance: He is well-developed and normal weight. He is not diaphoretic.     Comments: Well-appearing and in no acute distress  HENT:     Head: Normocephalic and atraumatic.     Mouth/Throat:  Mouth: Mucous membranes are moist.     Pharynx: Oropharynx is clear. Uvula midline. No pharyngeal swelling, oropharyngeal exudate, posterior oropharyngeal erythema or uvula swelling.     Tonsils: No tonsillar exudate. 0 on the right. 0 on the left.     Comments: Posterior oropharynx clear and mucous membranes moist, there is no erythema, edema or tonsillar exudates, uvula midline, normal phonation, no trismus, tolerating secretions without difficulty. Eyes:     General:        Right eye: No discharge.        Left eye: No discharge.  Neck:     Musculoskeletal: Normal range of motion and neck  supple.     Comments: Neck is supple and NTTP, no palpable masses, no stridor, no rigidity, no lymphadenopathy. Cardiovascular:     Rate and Rhythm: Normal rate and regular rhythm.     Heart sounds: Normal heart sounds. No murmur. No friction rub. No gallop.   Pulmonary:     Effort: Pulmonary effort is normal. No respiratory distress.     Breath sounds: Normal breath sounds.     Comments: Respirations equal and unlabored, patient able to speak in full sentences, lungs clear to auscultation bilaterally Abdominal:     General: Bowel sounds are normal. There is no distension.     Palpations: Abdomen is soft.     Tenderness: There is no abdominal tenderness.     Comments: Abdomen soft, nondistended, nontender to palpation in all quadrants without guarding or peritoneal signs  Lymphadenopathy:     Cervical: No cervical adenopathy.  Skin:    General: Skin is warm and dry.  Neurological:     Mental Status: He is alert.     Coordination: Coordination normal.  Psychiatric:        Mood and Affect: Mood normal.        Behavior: Behavior normal.      ED Treatments / Results  Labs (all labs ordered are listed, but only abnormal results are displayed) Labs Reviewed - No data to display  EKG None  Radiology No results found.  Procedures Procedures (including critical care time)  Medications Ordered in ED Medications  pantoprazole (PROTONIX) EC tablet 20 mg (has no administration in time range)  alum & mag hydroxide-simeth (MAALOX/MYLANTA) 200-200-20 MG/5ML suspension 30 mL (30 mLs Oral Given 02/22/19 1415)    And  lidocaine (XYLOCAINE) 2 % viscous mouth solution 15 mL (15 mLs Oral Given 02/22/19 1415)     Initial Impression / Assessment and Plan / ED Course  I have reviewed the triage vital signs and the nursing notes.  Pertinent labs & imaging results that were available during my care of the patient were reviewed by me and considered in my medical decision making (see chart for  details).  Patient presents to the emergency department for difficulty feeling and tightness, with evaluated for the same yesterday with reassuring work-up.  He reports symptoms came back last night, they had initially improved after GI cocktail yesterday and symptoms were thought to be caused by acid reflux.  On my exam patient has normal vitals aside from elevated blood pressure and he is well-appearing and in no acute distress he is not stridorous, tolerating secretions without difficulty, no trismus.  Posterior oropharynx is clear there is no evidence of swelling and no swelling or masses palpable in the neck, I have extremely low suspicion for infectious cause such as abscess, or mass as is her present symptoms I do think this could  certainly be related to GERD.  He was once again given a GI cocktail with improvement in his symptoms.  Have a discussion with patient about using PPI to help reduce acid reflux up into the esophagus which is likely causing esophageal spasm and sensation.  Patient then asks about coronavirus testing and I wonder if this is contributing to his anxiety with his symptoms, feel that his symptoms are suggestive of coronavirus, he is not a candidate for testing here at the hospital today provided resources for outpatient testing center.  He expresses understanding and agreement with plan.  Discharged home in good condition.  Final Clinical Impressions(s) / ED Diagnoses   Final diagnoses:  Dysphagia, unspecified type    ED Discharge Orders    None       Legrand Rams 02/23/19 2129    Melene Plan, DO 02/24/19 [phone]

## 2019-11-19 ENCOUNTER — Emergency Department (HOSPITAL_COMMUNITY): Payer: Medicaid Other

## 2019-11-19 ENCOUNTER — Encounter (HOSPITAL_COMMUNITY): Payer: Self-pay | Admitting: Emergency Medicine

## 2019-11-19 ENCOUNTER — Other Ambulatory Visit: Payer: Self-pay

## 2019-11-19 ENCOUNTER — Emergency Department (HOSPITAL_COMMUNITY)
Admission: EM | Admit: 2019-11-19 | Discharge: 2019-11-19 | Disposition: A | Discharge status: Home / Self Care | Payer: Medicaid Other | Attending: Emergency Medicine | Admitting: Emergency Medicine

## 2019-11-19 DIAGNOSIS — T148XXA Other injury of unspecified body region, initial encounter: Secondary | ICD-10-CM | POA: Insufficient documentation

## 2019-11-19 DIAGNOSIS — F17210 Nicotine dependence, cigarettes, uncomplicated: Secondary | ICD-10-CM | POA: Diagnosis not present

## 2019-11-19 DIAGNOSIS — Y9389 Activity, other specified: Secondary | ICD-10-CM | POA: Diagnosis not present

## 2019-11-19 DIAGNOSIS — Z79899 Other long term (current) drug therapy: Secondary | ICD-10-CM | POA: Diagnosis not present

## 2019-11-19 DIAGNOSIS — Z23 Encounter for immunization: Secondary | ICD-10-CM | POA: Insufficient documentation

## 2019-11-19 DIAGNOSIS — T07XXXA Unspecified multiple injuries, initial encounter: Secondary | ICD-10-CM

## 2019-11-19 DIAGNOSIS — S0990XA Unspecified injury of head, initial encounter: Secondary | ICD-10-CM | POA: Diagnosis present

## 2019-11-19 DIAGNOSIS — Y999 Unspecified external cause status: Secondary | ICD-10-CM | POA: Insufficient documentation

## 2019-11-19 DIAGNOSIS — S0181XA Laceration without foreign body of other part of head, initial encounter: Secondary | ICD-10-CM | POA: Insufficient documentation

## 2019-11-19 DIAGNOSIS — S93402A Sprain of unspecified ligament of left ankle, initial encounter: Secondary | ICD-10-CM | POA: Diagnosis not present

## 2019-11-19 DIAGNOSIS — Y9289 Other specified places as the place of occurrence of the external cause: Secondary | ICD-10-CM | POA: Insufficient documentation

## 2019-11-19 MED ORDER — LIDOCAINE-EPINEPHRINE (PF) 1 %-1:200000 IJ SOLN
10.0000 mL | Freq: Once | INTRAMUSCULAR | Status: AC
  Administered 2019-11-19: 10 mL
  Filled 2019-11-19: qty 30

## 2019-11-19 MED ORDER — IBUPROFEN 800 MG PO TABS
800.0000 mg | ORAL_TABLET | Freq: Once | ORAL | Status: AC
  Administered 2019-11-19: 02:00:00 800 mg via ORAL
  Filled 2019-11-19: qty 1

## 2019-11-19 MED ORDER — OXYCODONE-ACETAMINOPHEN 5-325 MG PO TABS
1.0000 | ORAL_TABLET | Freq: Once | ORAL | Status: AC
  Administered 2019-11-19: 1 via ORAL
  Filled 2019-11-19: qty 1

## 2019-11-19 MED ORDER — TETANUS-DIPHTH-ACELL PERTUSSIS 5-2.5-18.5 LF-MCG/0.5 IM SUSP
0.5000 mL | Freq: Once | INTRAMUSCULAR | Status: AC
  Administered 2019-11-19: 02:00:00 0.5 mL via INTRAMUSCULAR
  Filled 2019-11-19: qty 0.5

## 2019-11-19 MED ORDER — IBUPROFEN 800 MG PO TABS: 800.0000 mg | ORAL_TABLET | Freq: Four times a day (QID) | ORAL | 0 refills | Status: AC | PRN

## 2019-11-19 MED ORDER — CYCLOBENZAPRINE HCL 10 MG PO TABS: 10.0000 mg | ORAL_TABLET | Freq: Three times a day (TID) | ORAL | 0 refills | Status: DC | PRN

## 2019-11-19 MED ORDER — HYDROCODONE-ACETAMINOPHEN 5-325 MG PO TABS: 1.0000 | ORAL_TABLET | ORAL | 0 refills | Status: DC | PRN

## 2019-11-19 NOTE — Discharge Instructions (Signed)
Return to the emergency department if you develop chest pain, difficulty breathing, abdominal pain, dizziness or passing out.
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
## 2019-11-21 ENCOUNTER — Ambulatory Visit
Admission: EM | Admit: 2019-11-21 | Discharge: 2019-11-21 | Disposition: A | Discharge status: Home / Self Care | Payer: Medicaid Other | Attending: Emergency Medicine | Admitting: Emergency Medicine

## 2019-11-21 ENCOUNTER — Other Ambulatory Visit: Payer: Self-pay

## 2019-11-21 DIAGNOSIS — T783XXA Angioneurotic edema, initial encounter: Secondary | ICD-10-CM | POA: Diagnosis not present

## 2019-11-21 MED ORDER — METHYLPREDNISOLONE SODIUM SUCC 125 MG IJ SOLR
60.0000 mg | Freq: Once | INTRAMUSCULAR | Status: AC
  Administered 2019-11-21: 60 mg via INTRAMUSCULAR

## 2019-11-21 MED ORDER — DIPHENHYDRAMINE HCL 25 MG PO TABS: 25.0000 mg | ORAL_TABLET | Freq: Four times a day (QID) | ORAL | 0 refills | Status: AC | PRN

## 2019-11-21 MED ORDER — FAMOTIDINE 20 MG PO TABS
20.0000 mg | ORAL_TABLET | Freq: Once | ORAL | Status: AC
  Administered 2019-11-21: 20 mg via ORAL

## 2019-11-21 MED ORDER — PREDNISONE 10 MG (21) PO TBPK: ORAL_TABLET | ORAL | 0 refills | Status: DC
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
## 2019-11-21 NOTE — Discharge Instructions (Addendum)
Advised patient to stop using Flexeril Patient was advised to follow up with primary care to get a referral to see an allergist Patient was advised to seek immediate care if you are having mouth, tongue and lips swollen; difficulty breathing or swallowing, passing out or faint.

## 2019-11-21 NOTE — ED Triage Notes (Signed)
Pt presents with both eyes red and swollen, denies sob or lip swelling. Pt was seen in ED on 3/1  Was given flexeril, t dap and ibuprofen , began having swelling next day

## 2020-05-25 ENCOUNTER — Other Ambulatory Visit: Payer: Self-pay

## 2020-05-25 ENCOUNTER — Ambulatory Visit: Payer: Self-pay

## 2020-05-25 ENCOUNTER — Ambulatory Visit
Admission: EM | Admit: 2020-05-25 | Discharge: 2020-05-25 | Disposition: A | Discharge status: Home / Self Care | Payer: Medicaid Other | Attending: Emergency Medicine | Admitting: Emergency Medicine

## 2020-05-25 DIAGNOSIS — R6889 Other general symptoms and signs: Secondary | ICD-10-CM | POA: Diagnosis not present

## 2020-05-25 DIAGNOSIS — Z1152 Encounter for screening for COVID-19: Secondary | ICD-10-CM

## 2020-05-25 DIAGNOSIS — Z20822 Contact with and (suspected) exposure to covid-19: Secondary | ICD-10-CM

## 2020-05-25 MED ORDER — BENZONATATE 100 MG PO CAPS: 100.0000 mg | ORAL_CAPSULE | Freq: Three times a day (TID) | ORAL | 0 refills | Status: AC

## 2020-05-25 NOTE — Discharge Instructions (Signed)
COVID testing ordered.  It will take between 5-7 days for test results.  Someone will contact you regarding abnormal results.    In the meantime: You should remain isolated in your home for 10 days from symptom onset AND greater than 72 hours after symptoms resolution (absence of fever without the use of fever-reducing medication and improvement in respiratory symptoms), whichever is longer Get plenty of rest and push fluids Use OTC zyrtec for nasal congestion, runny nose, and/or sore throat Use OTC flonase for nasal congestion and runny nose Use medications daily for symptom relief Use OTC medications like ibuprofen or tylenol as needed fever or pain Call or go to the ED if you have any new or worsening symptoms such as fever, worsening cough, shortness of breath, chest tightness, chest pain, turning blue, changes in mental status, etc...  

## 2020-05-25 NOTE — ED Triage Notes (Signed)
Had positive in home test

## 2020-05-25 NOTE — ED Triage Notes (Signed)
Pt presents with headache, chills and dizziness, with some sob , developed 4 days ago . usure if covid exposure

## 2020-05-25 NOTE — ED Provider Notes (Addendum)
[facility]   [id number] 05/25/20 Arrival Time: 1319   CC: COVID symptoms  SUBJECTIVE: History from: patient.  Bradley Wilcox is a 24 y.o. male who presents with headache, chills, lightheadedness, sore throat, cough, and SOB x 4 days.  Denies sick exposure to COVID, flu or strep.  Positive COVID test at home.  Denies alleviating or aggravating factors.  Denies previous COVID infection in the past.   Has not received covid vaccine.  Denies fever, chills, wheezing, chest pain, nausea, changes in bowel or bladder habits.    ROS: As per HPI.  All other pertinent ROS negative.     History reviewed. No pertinent past medical history. History reviewed. No pertinent surgical history. Allergies  Allergen Reactions  . Flexeril [Cyclobenzaprine]     Swelling    No current facility-administered medications on file prior to encounter.   Current Outpatient Medications on File Prior to Encounter  Medication Sig Dispense Refill  . diphenhydrAMINE (BENADRYL) 25 MG tablet Take 1 tablet (25 mg total) by mouth every 6 (six) hours as needed. 30 tablet 0  . ibuprofen (ADVIL) 800 MG tablet Take 1 tablet (800 mg total) by mouth every 6 (six) hours as needed for moderate pain. 20 tablet 0  . [DISCONTINUED] pantoprazole (PROTONIX) 20 MG tablet Take 1 tablet (20 mg total) by mouth daily for 14 days. 14 tablet 0   Social History   Socioeconomic History  . Marital status: Single    Spouse name: Not on file  . Number of children: Not on file  . Years of education: Not on file  . Highest education level: Not on file  Occupational History  . Not on file  Tobacco Use  . Smoking status: Current Every Day Smoker    Packs/day: 1.00    Types: Cigarettes  . Smokeless tobacco: Never Used  Vaping Use  . Vaping Use: Never used  Substance and Sexual Activity  . Alcohol use: Yes  . Drug use: Never  . Sexual activity: Yes  Other Topics Concern  . Not on file  Social History Narrative   ** Merged  History Encounter **       Social Determinants of Health   Financial Resource Strain:   . Difficulty of Paying Living Expenses: Not on file  Food Insecurity:   . Worried About Programme researcher, broadcasting/film/video in the Last Year: Not on file  . Ran Out of Food in the Last Year: Not on file  Transportation Needs:   . Lack of Transportation (Medical): Not on file  . Lack of Transportation (Non-Medical): Not on file  Physical Activity:   . Days of Exercise per Week: Not on file  . Minutes of Exercise per Session: Not on file  Stress:   . Feeling of Stress : Not on file  Social Connections:   . Frequency of Communication with Friends and Family: Not on file  . Frequency of Social Gatherings with Friends and Family: Not on file  . Attends Religious Services: Not on file  . Active Member of Clubs or Organizations: Not on file  . Attends Banker Meetings: Not on file  . Marital Status: Not on file  Intimate Partner Violence:   . Fear of Current or Ex-Partner: Not on file  . Emotionally Abused: Not on file  . Physically Abused: Not on file  . Sexually Abused: Not on file   Family History  Problem Relation Age of Onset  . Healthy Mother   .  Healthy Father     OBJECTIVE:  Vitals:   05/25/20 1404  BP: 117/82  Pulse: (!) 104  Resp: 18  Temp: 98.4 F (36.9 C)  SpO2: 98%     General appearance: alert; appears fatigued, but nontoxic; speaking in full sentences and tolerating own secretions HEENT: NCAT; Ears: EACs clear, TMs pearly gray; Eyes: PERRL.  EOM grossly intact. Nose: nares patent without rhinorrhea, Throat: oropharynx clear, tonsils non erythematous or enlarged, uvula midline  Neck: supple without LAD Lungs: unlabored respirations, symmetrical air entry; cough: absent; no respiratory distress; CTAB Heart: regular rate and rhythm.   Skin: warm and dry Psychological: alert and cooperative; normal mood and affect  ASSESSMENT & PLAN:  1. Encounter for screening for  COVID-19   2. Flu-like symptoms   3. Suspected COVID-19 virus infection     Meds ordered this encounter  Medications  . benzonatate (TESSALON) 100 MG capsule    Sig: Take 1 capsule (100 mg total) by mouth every 8 (eight) hours.    Dispense:  21 capsule    Refill:  0    Order Specific Question:   Supervising Provider    Answer:   Eustace Moore [[id number]]   COVID testing ordered.  It will take between 5-7 days for test results.  Someone will contact you regarding abnormal results.    In the meantime: You should remain isolated in your home for 10 days from symptom onset AND greater than 72 hours after symptoms resolution (absence of fever without the use of fever-reducing medication and improvement in respiratory symptoms), whichever is longer Get plenty of rest and push fluids Tesslon perles for cough Use OTC zyrtec for nasal congestion, runny nose, and/or sore throat Use OTC flonase for nasal congestion and runny nose Use medications daily for symptom relief Use OTC medications like ibuprofen or tylenol as needed fever or pain Call or go to the ED if you have any new or worsening symptoms such as fever, worsening cough, shortness of breath, chest tightness, chest pain, turning blue, changes in mental status, etc...   Reviewed expectations re: course of current medical issues. Questions answered. Outlined signs and symptoms indicating need for more acute intervention. Patient verbalized understanding. After Visit Summary given.    Rennis Harding, PA-C 05/25/20 1417

## 2020-05-26 LAB — NOVEL CORONAVIRUS, NAA: SARS-CoV-2, NAA: DETECTED — AB

## 2020-05-27 ENCOUNTER — Other Ambulatory Visit (HOSPITAL_COMMUNITY): Payer: Self-pay | Admitting: Adult Health

## 2020-05-27 ENCOUNTER — Encounter: Payer: Self-pay | Admitting: Adult Health

## 2020-05-27 DIAGNOSIS — U071 COVID-19: Secondary | ICD-10-CM

## 2020-05-27 NOTE — Progress Notes (Signed)
I connected by phone with Bradley Wilcox on 05/27/2020 at 3:51 PM to discuss the potential use of a new treatment for mild to moderate COVID-19 viral infection in non-hospitalized patients.  This patient is a 24 y.o. male that meets the FDA criteria for Emergency Use Authorization of COVID monoclonal antibody casirivimab/imdevimab.  Has a (+) direct SARS-CoV-2 viral test result  Has mild or moderate COVID-19   Is NOT hospitalized due to COVID-19  Is within 10 days of symptom onset  Has at least one of the high risk factor(s) for progression to severe COVID-19 and/or hospitalization as defined in EUA.  Specific high risk criteria : BMI > 25, Chronic Lung Disease and Other high risk medical condition per CDC:  h/o ETOH abuse, social risk   I have spoken and communicated the following to the patient or parent/caregiver regarding COVID monoclonal antibody treatment:  1. FDA has authorized the emergency use for the treatment of mild to moderate COVID-19 in adults and pediatric patients with positive results of direct SARS-CoV-2 viral testing who are 60 years of age and older weighing at least 40 kg, and who are at high risk for progressing to severe COVID-19 and/or hospitalization.  2. The significant known and potential risks and benefits of COVID monoclonal antibody, and the extent to which such potential risks and benefits are unknown.  3. Information on available alternative treatments and the risks and benefits of those alternatives, including clinical trials.  4. Patients treated with COVID monoclonal antibody should continue to self-isolate and use infection control measures (e.g., wear mask, isolate, social distance, avoid sharing personal items, clean and disinfect "high touch" surfaces, and frequent handwashing) according to CDC guidelines.   5. The patient or parent/caregiver has the option to accept or refuse COVID monoclonal antibody treatment.  After reviewing this information  with the patient, The patient agreed to proceed with receiving casirivimab\imdevimab infusion and will be provided a copy of the Fact sheet prior to receiving the infusion. [name] 05/27/2020 3:51 PM

## 2020-05-28 ENCOUNTER — Ambulatory Visit (HOSPITAL_COMMUNITY): Payer: No Typology Code available for payment source | Attending: Pulmonary Disease

## 2022-01-26 ENCOUNTER — Emergency Department (HOSPITAL_COMMUNITY): Payer: Medicaid Other

## 2022-01-26 ENCOUNTER — Encounter (HOSPITAL_COMMUNITY): Payer: Self-pay | Admitting: *Deleted

## 2022-01-26 ENCOUNTER — Emergency Department (HOSPITAL_COMMUNITY)
Admission: EM | Admit: 2022-01-26 | Discharge: 2022-01-26 | Disposition: A | Discharge status: Home / Self Care | Payer: Medicaid Other | Attending: Emergency Medicine | Admitting: Emergency Medicine

## 2022-01-26 ENCOUNTER — Other Ambulatory Visit: Payer: Self-pay

## 2022-01-26 DIAGNOSIS — I10 Essential (primary) hypertension: Secondary | ICD-10-CM | POA: Diagnosis not present

## 2022-01-26 DIAGNOSIS — R7989 Other specified abnormal findings of blood chemistry: Secondary | ICD-10-CM | POA: Diagnosis not present

## 2022-01-26 DIAGNOSIS — R42 Dizziness and giddiness: Secondary | ICD-10-CM

## 2022-01-26 DIAGNOSIS — F1010 Alcohol abuse, uncomplicated: Secondary | ICD-10-CM | POA: Insufficient documentation

## 2022-01-26 LAB — COMPREHENSIVE METABOLIC PANEL
ALT: 168 U/L — ABNORMAL HIGH (ref 0–44)
AST: 255 U/L — ABNORMAL HIGH (ref 15–41)
Albumin: 4.2 g/dL (ref 3.5–5.0)
Alkaline Phosphatase: 90 U/L (ref 38–126)
Anion gap: 12 (ref 5–15)
BUN: 15 mg/dL (ref 6–20)
CO2: 19 mmol/L — ABNORMAL LOW (ref 22–32)
Calcium: 8.8 mg/dL — ABNORMAL LOW (ref 8.9–10.3)
Chloride: 104 mmol/L (ref 98–111)
Creatinine, Ser: 0.88 mg/dL (ref 0.61–1.24)
GFR, Estimated: >60 mL/min (ref >60)
Glucose, Bld: 108 mg/dL — ABNORMAL HIGH (ref 70–99)
Potassium: 4 mmol/L (ref 3.5–5.1)
Sodium: 135 mmol/L (ref 135–145)
Total Bilirubin: 0.5 mg/dL (ref 0.3–1.2)
Total Protein: 7.7 g/dL (ref 6.5–8.1)

## 2022-01-26 LAB — CBC WITH DIFFERENTIAL/PLATELET
Abs Immature Granulocytes: 0.04 10*3/uL (ref 0.00–0.07)
Basophils Absolute: 0.1 10*3/uL (ref 0.0–0.1)
Basophils Relative: 1 %
Eosinophils Absolute: 0.1 10*3/uL (ref 0.0–0.5)
Eosinophils Relative: 1 %
HCT: 42.5 % (ref 39.0–52.0)
Hemoglobin: 13.9 g/dL (ref 13.0–17.0)
Immature Granulocytes: 1 %
Lymphocytes Relative: 23 %
Lymphs Abs: 1.9 10*3/uL (ref 0.7–4.0)
MCH: 30.7 pg (ref 26.0–34.0)
MCHC: 32.7 g/dL (ref 30.0–36.0)
MCV: 93.8 fL (ref 80.0–100.0)
Monocytes Absolute: 0.7 10*3/uL (ref 0.1–1.0)
Monocytes Relative: 9 %
Neutro Abs: 5.3 10*3/uL (ref 1.7–7.7)
Neutrophils Relative %: 65 %
Platelets: 334 10*3/uL (ref 150–400)
RBC: 4.53 MIL/uL (ref 4.22–5.81)
RDW: 13 % (ref 11.5–15.5)
WBC: 8.1 10*3/uL (ref 4.0–10.5)
nRBC: 0 % (ref 0.0–0.2)

## 2022-01-26 LAB — TROPONIN I (HIGH SENSITIVITY)
Troponin I (High Sensitivity): 4 ng/L (ref <18)
Troponin I (High Sensitivity): 5 ng/L (ref <18)

## 2022-01-26 LAB — TSH: TSH: 0.775 u[IU]/mL (ref 0.350–4.500)

## 2022-01-26 MED ORDER — AMLODIPINE BESYLATE 10 MG PO TABS: 10.0000 mg | ORAL_TABLET | Freq: Every day | ORAL | 0 refills | Status: AC

## 2022-01-26 NOTE — ED Provider Notes (Signed)
? ?Emergency Department Provider Note ? ? ?I have reviewed the triage vital signs and the nursing notes. ? ? ?HISTORY ? ?Chief Complaint ?Dizziness ? ? ?HPI ?Bradley Wilcox is a 26 y.o. male with PMH reviewed including HTN presents to the ED with acute onset lightheadedness and weakness. Symptoms began just PTA. No CP or palpitations. No fever. Has noted some tingling in the right leg. No weakness or gait instability. No HA.  ? ? ?History reviewed. No pertinent past medical history. ? ?Review of Systems ? ?Constitutional: No fever/chills. Positive lightheadedness.  ?Eyes: No visual changes. ?ENT: No sore throat. ?Cardiovascular: Denies chest pain. ?Respiratory: Denies shortness of breath. ?Gastrointestinal: No abdominal pain.  No nausea, no vomiting.  No diarrhea.  No constipation. ?Genitourinary: Negative for dysuria. ?Musculoskeletal: Negative for back pain. ?Skin: Negative for rash. ?Neurological: Negative for headaches, focal weakness or numbness. Tingling in the right leg.  ? ? ?____________________________________________ ? ? ?PHYSICAL EXAM: ? ?VITAL SIGNS: ?ED Triage Vitals  ?Enc Vitals Group  ?   BP 01/26/22 2037 (!) 183/129  ?   Pulse Rate 01/26/22 2037 (!) 111  ?   Resp 01/26/22 2037 18  ?   Temp 01/26/22 2037 98.8 ?F (37.1 ?C)  ?   Temp Source 01/26/22 2037 Oral  ?   SpO2 01/26/22 2037 99 %  ?   Weight 01/26/22 2037 245 lb (111.1 kg)  ?   Height 01/26/22 2037 6' (1.829 m)  ? ?Constitutional: Alert and oriented. Well appearing and in no acute distress. ?Eyes: Conjunctivae are normal. PERRL. EOMI. ?Head: Atraumatic. ?Nose: No congestion/rhinnorhea. ?Mouth/Throat: Mucous membranes are moist.   ?Neck: No stridor.   ?Cardiovascular: Normal rate, regular rhythm. Good peripheral circulation. Grossly normal heart sounds.   ?Respiratory: Normal respiratory effort.  No retractions. Lungs CTAB. ?Gastrointestinal: Soft and nontender. No distention.  ?Musculoskeletal: No lower extremity tenderness nor edema. No  gross deformities of extremities. ?Neurologic:  Normal speech and language. No gross focal neurologic deficits are appreciated. 5/5 strength in the bilateral upper/lower extremities with normal sensation throughout.  ?Skin:  Skin is warm, dry and intact. No rash noted. ? ? ?____________________________________________ ?  ?LABS ?(all labs ordered are listed, but only abnormal results are displayed) ? ?Labs Reviewed  ?COMPREHENSIVE METABOLIC PANEL - Abnormal; Notable for the following components:  ?    Result Value  ? CO2 19 (*)   ? Glucose, Bld 108 (*)   ? Calcium 8.8 (*)   ? AST 255 (*)   ? ALT 168 (*)   ? All other components within normal limits  ?CBC WITH DIFFERENTIAL/PLATELET  ?TSH  ?RAPID URINE DRUG SCREEN, HOSP PERFORMED  ?TROPONIN I (HIGH SENSITIVITY)  ?TROPONIN I (HIGH SENSITIVITY)  ? ?____________________________________________ ? ?EKG ? ? EKG Interpretation ? ?Date/Time:  Tuesday Jan 26 2022 20:43:44 EDT ?Ventricular Rate:  114 ?PR Interval:  118 ?QRS Duration: 82 ?QT Interval:  350 ?QTC Calculation: 482 ?R Axis:   68 ?Text Interpretation: Sinus tachycardia Otherwise normal ECG No previous ECGs available Confirmed by Alona Bene [phone]) on 01/26/2022 8:56:54 PM ?  ? ?  ? ? ?____________________________________________ ? ?RADIOLOGY ? ?DG Chest 2 View ? ?Result Date: 01/26/2022 ?CLINICAL DATA:  Dizziness.  Generalized weakness. EXAM: CHEST - 2 VIEW COMPARISON:  None Available. FINDINGS: The cardiomediastinal contours are normal. The lungs are clear. Pulmonary vasculature is normal. No consolidation, pleural effusion, or pneumothorax. No acute osseous abnormalities are seen. IMPRESSION: Negative radiographs of the chest. Electronically Signed   By: Narda Rutherford  M.D.   On: 01/26/2022 21:51  ? ?CT HEAD WO CONTRAST ( ) ? ?Result Date: 01/26/2022 ?CLINICAL DATA:  Dizziness and weakness. EXAM: CT HEAD WITHOUT CONTRAST TECHNIQUE: Contiguous axial images were obtained from the base of the skull through the vertex  without intravenous contrast. RADIATION DOSE REDUCTION: This exam was performed according to the departmental dose-optimization program which includes automated exposure control, adjustment of the mA and/or kV according to patient size and/or use of iterative reconstruction technique. COMPARISON:  Head CT dated 11/19/2019. FINDINGS: Brain: The ventricles and sulci are appropriate size for the patient's age. The gray-white matter discrimination is preserved. There is no acute intracranial hemorrhage. No mass effect or midline shift. No extra-axial fluid collection. Vascular: No hyperdense vessel or unexpected calcification. Skull: Normal. Negative for fracture or focal lesion. Sinuses/Orbits: No acute finding. Other: None IMPRESSION: Unremarkable noncontrast CT of the brain. Electronically Signed   By: Elgie Collard M.D.   On: 01/26/2022 21:50   ? ?____________________________________________ ? ? ?PROCEDURES ? ?Procedure(s) performed:  ? ?Procedures ? ?None  ?____________________________________________ ? ? ?INITIAL IMPRESSION / ASSESSMENT AND PLAN / ED COURSE ? ?Pertinent labs & imaging results that were available during my care of the patient were reviewed by me and considered in my medical decision making (see chart for details). ?  ?This patient is Presenting for Evaluation of lightheadedness, which does require a range of treatment options, and is a complaint that involves a high risk of morbidity and mortality. ? ?The Differential Diagnoses include HTN urgency/emergency, CVA, ICH, ACS, PE. ? ? ?I decided to review pertinent External Data, and in summary patient seen with similar symptoms in 2019. Did not follow up for HTN from that visit. ?  ?Clinical Laboratory Tests Ordered, included troponin which is in normal limits.  No acute kidney injury.  CBC with no leukocytosis or anemia.  AST and ALT are elevated, likely due to drinking.  Bilirubin is normal.  No abdominal tenderness, fever, altered mental  status ? ?Radiologic Tests Ordered, included CT head and CXR. I independently interpreted the images and agree with radiology interpretation.  ? ?Cardiac Monitor Tracing which shows NSR. ? ? ?Social Determinants of Health Risk positive daily EtOH use.  ? ? ?Medical Decision Making: Summary:  ?Patient presents emergency department with lightheadedness.  No focal deficits on my initial neuro exam.  Lab work is similarly unremarkable other than elevated LFTs.  AST higher than ALT with normal bilirubin.  No abdominal tenderness.  Do not feel that emergent abdominal imaging is required.  Patient does have history of alcohol abuse.  We discussed this in detail.  I provided contact information for PCP locally to assist with blood pressure management as well as GI to follow on liver enzymes.  Patient cautioned regarding his alcohol use and that it appears to be causing injury to the liver.  ? ?Disposition: discharge ? ?____________________________________________ ? ?FINAL CLINICAL IMPRESSION(S) / ED DIAGNOSES ? ?Final diagnoses:  ?Lightheadedness  ?Primary hypertension  ?Elevated LFTs  ?Alcohol abuse  ? ? ? ?NEW OUTPATIENT MEDICATIONS STARTED DURING THIS VISIT: ? ?New Prescriptions  ? AMLODIPINE (NORVASC) 10 MG TABLET    Take 1 tablet (10 mg total) by mouth daily.  ? ? ?Note:  This document was prepared using Dragon voice recognition software and may include unintentional dictation errors. ? ?Alona Bene, MD, FACEP ?Emergency Medicine ? ?  ?Maia Plan, MD ?01/27/22 2303 ? ?

## 2022-01-26 NOTE — ED Triage Notes (Signed)
Pt with dizziness and generalized weakness, ears ringing bilaterally. Pressure to ears. Starting an hour ago.  Pt states hx of the same in the past and did not get it checked out.  ?

## 2022-01-26 NOTE — ED Notes (Signed)
Patient transported to CT 

## 2022-01-26 NOTE — Discharge Instructions (Signed)
You were seen in the emergency room today with lightheadedness.  Your blood pressure remains high and I am starting you on blood pressure medication.  This will need to be followed closely by primary care doctor to make sure this medication is working.  We discussed your lab abnormalities including your high liver enzymes.  I suspect this is due to your drinking.  I have listed the name of a liver doctor on the form here and will need you to call first thing tomorrow morning to schedule the next available appointment.  Please consider cutting back and/or stopping your alcohol use.  If you need assistance with this you can follow-up with your primary care doctor or return to the emergency department for evaluation.  Please also return if you develop any sudden severe headache, abdominal pain, fever, numbness/weakness. ?

## 2023-03-21 DEATH — deceased

## 2024-01-19 IMAGING — CT CT HEAD W/O CM
4 series · 16 of 47 positions shown, 18 images · non-contrast
Comparison: Head CT dated 11/19/2019.

CLINICAL DATA: Dizziness and weakness.



[Series 2: head w o · axial · 0.45mm/px · z∈[+97,+217]mm · 7 of 32 slices shown, 9 images]
[im 4/32  brain]
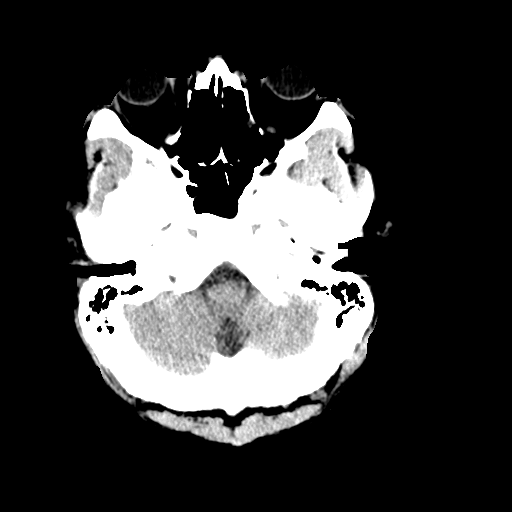
[im 4/32  bone]
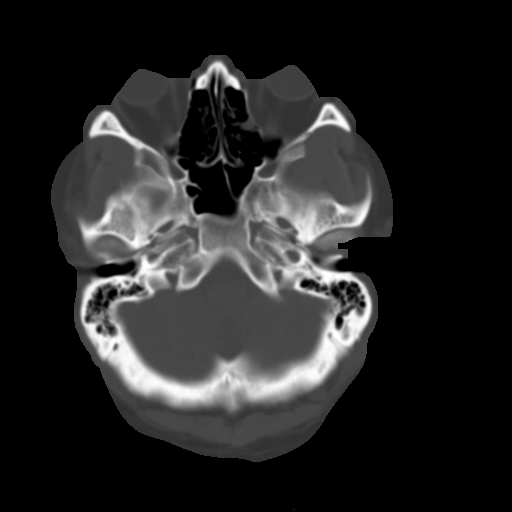
[im 8/32  brain]
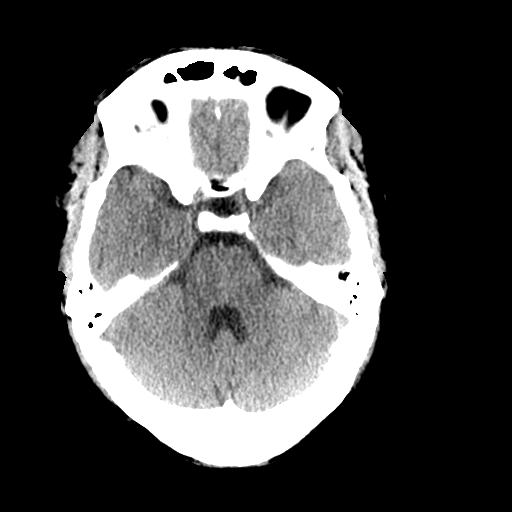
[im 12/32  brain]
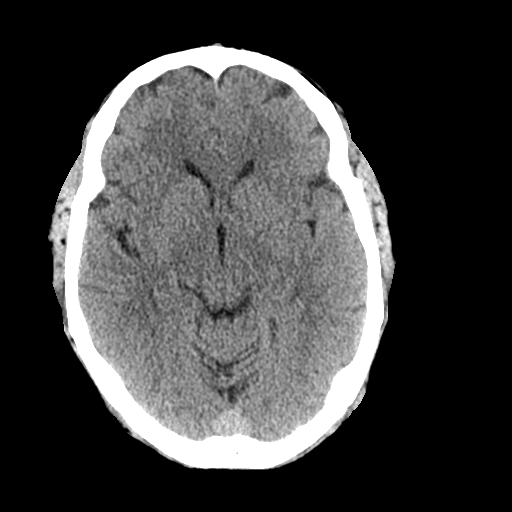
[im 16/32  brain]
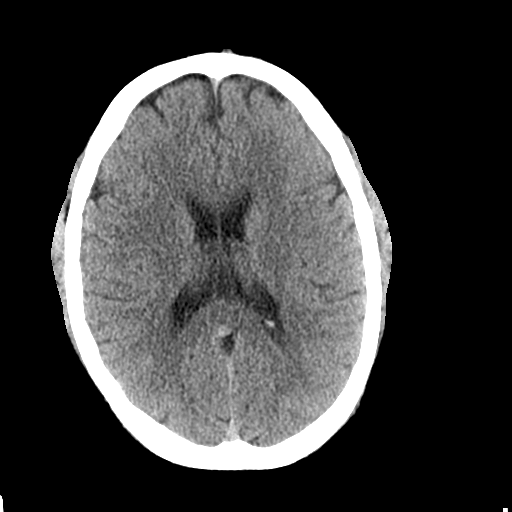
[im 20/32  brain]
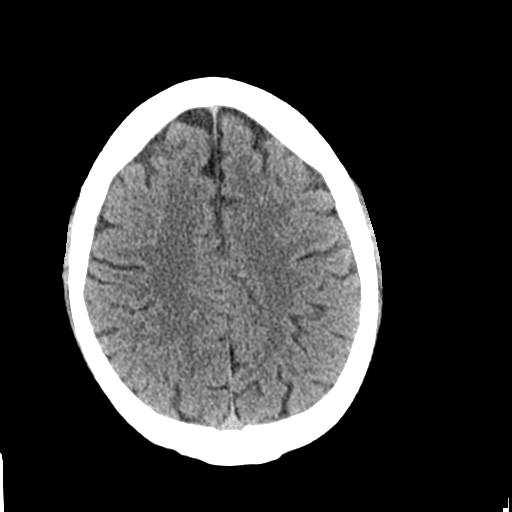
[im 20/32  bone]
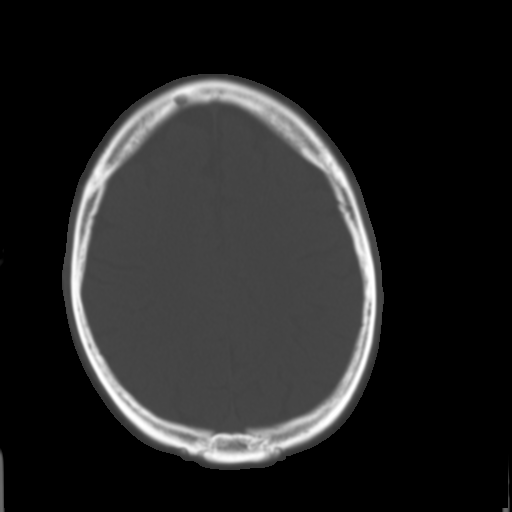
[im 24/32  brain]
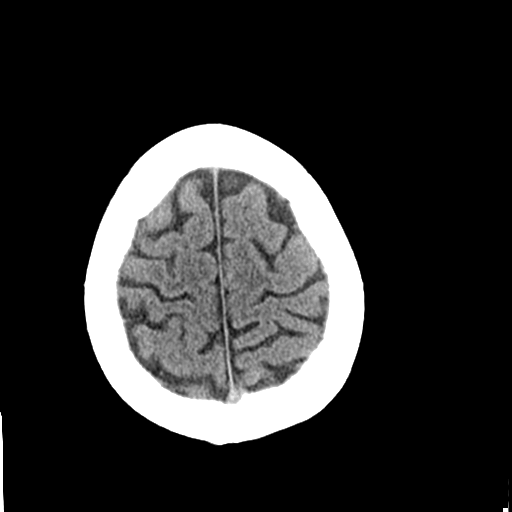
[im 28/32  brain]
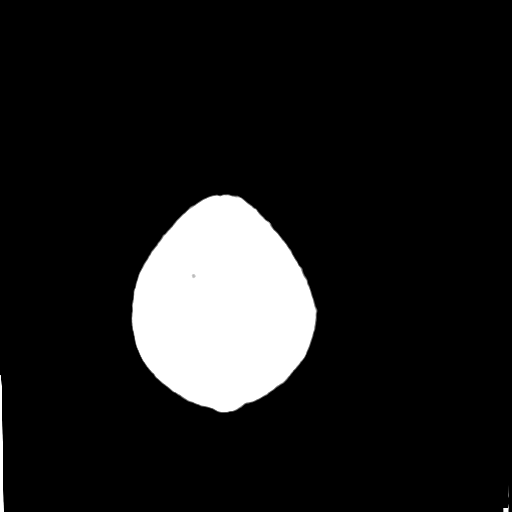

[Series 3: head bone · axial · 0.45mm/px · z∈[+96,+128]mm · 3 of 79 slices shown]
[im 8/79  bone]
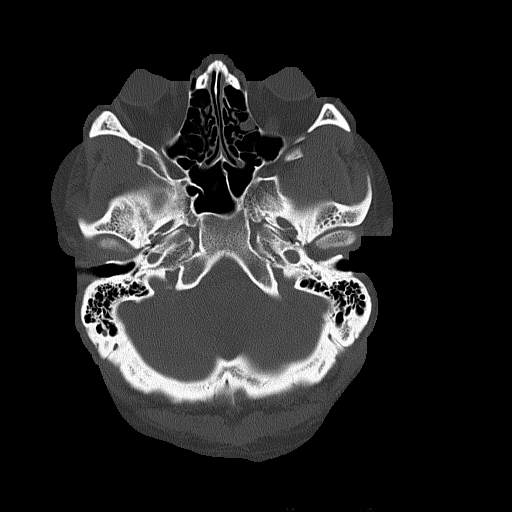
[im 16/79  bone]
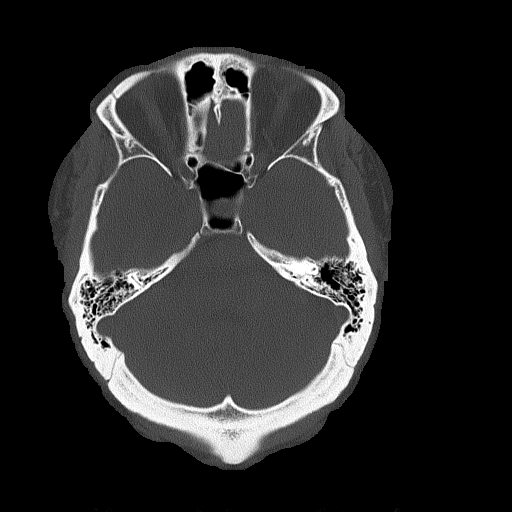
[im 24/79  bone]
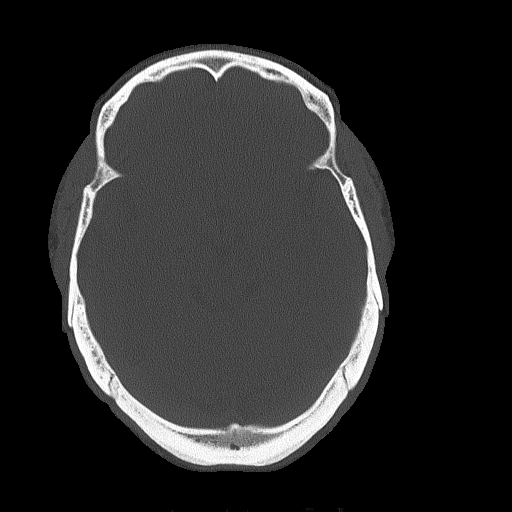

[Series 4: coronal soft · coronal · 0.34mm/px · 3 of 72 slices shown]
[im 24/72  brain]
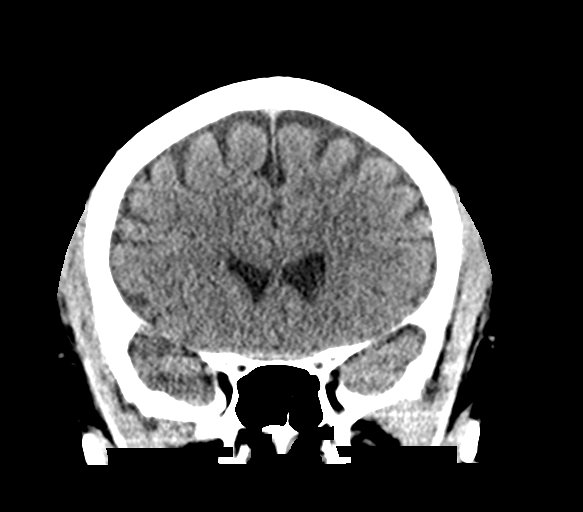
[im 32/72  brain]
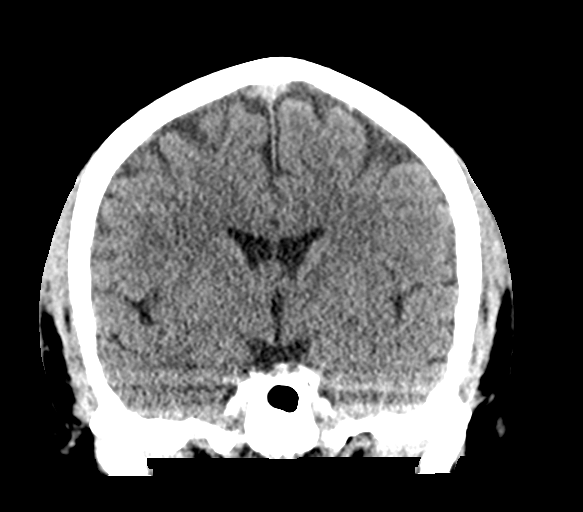
[im 40/72  brain]
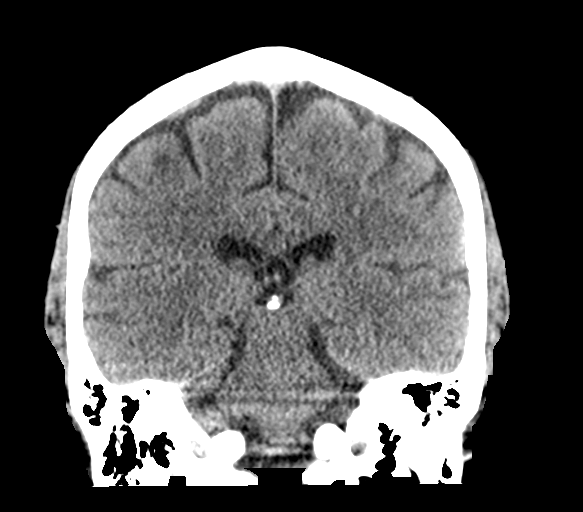

[Series 5: sagittal soft · sagittal · 0.32mm/px · 3 of 58 slices shown]
[im 20/58  brain]
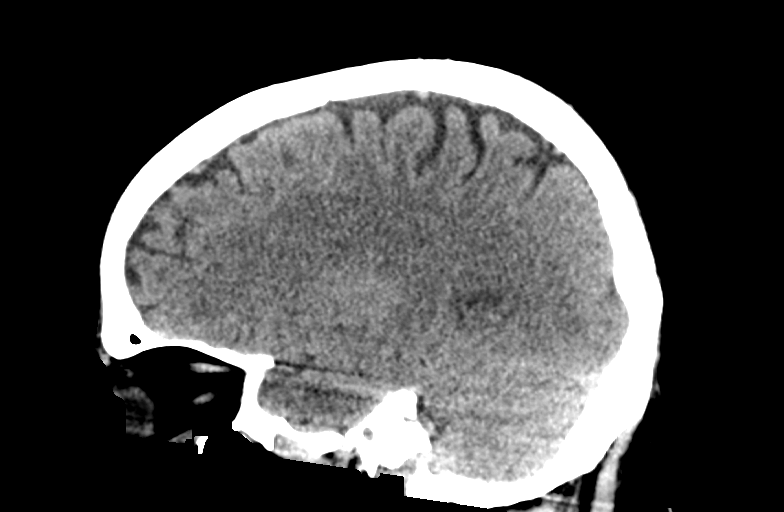
[im 29/58  brain]
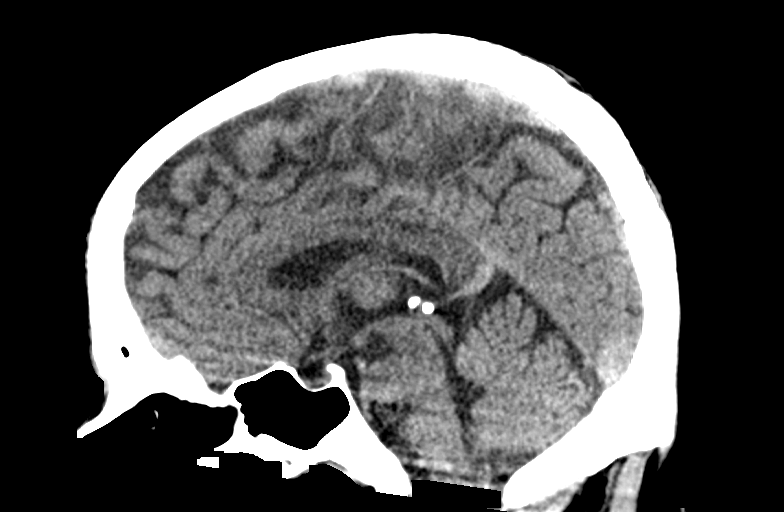
[im 39/58  brain]
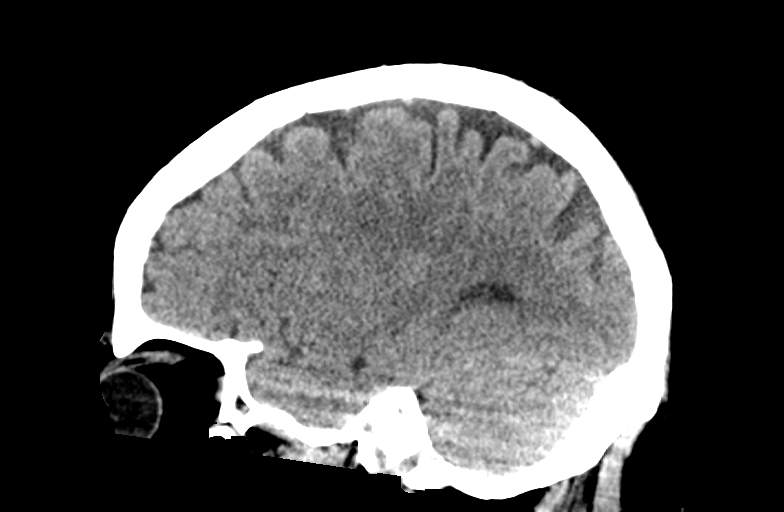

[16 of 47 positions shown; findings below may reference images not displayed]

FINDINGS: Brain: The ventricles and sulci are appropriate size for the
patient's age. The gray-white matter discrimination is preserved.
There is no acute intracranial hemorrhage. No mass effect or midline
shift. No extra-axial fluid collection.

Vascular: No hyperdense vessel or unexpected calcification.

Skull: Normal. Negative for fracture or focal lesion.

Sinuses/Orbits: No acute finding.

Other: None
IMPRESSION: Unremarkable noncontrast CT of the brain.

## 2024-01-19 IMAGING — DX DG CHEST 2V
2 series · 2 of 2 positions shown · non-contrast
Comparison: None Available.

CLINICAL DATA: Dizziness.  Generalized weakness.

EXAM:
CHEST - 2 VIEW

[chest pa]
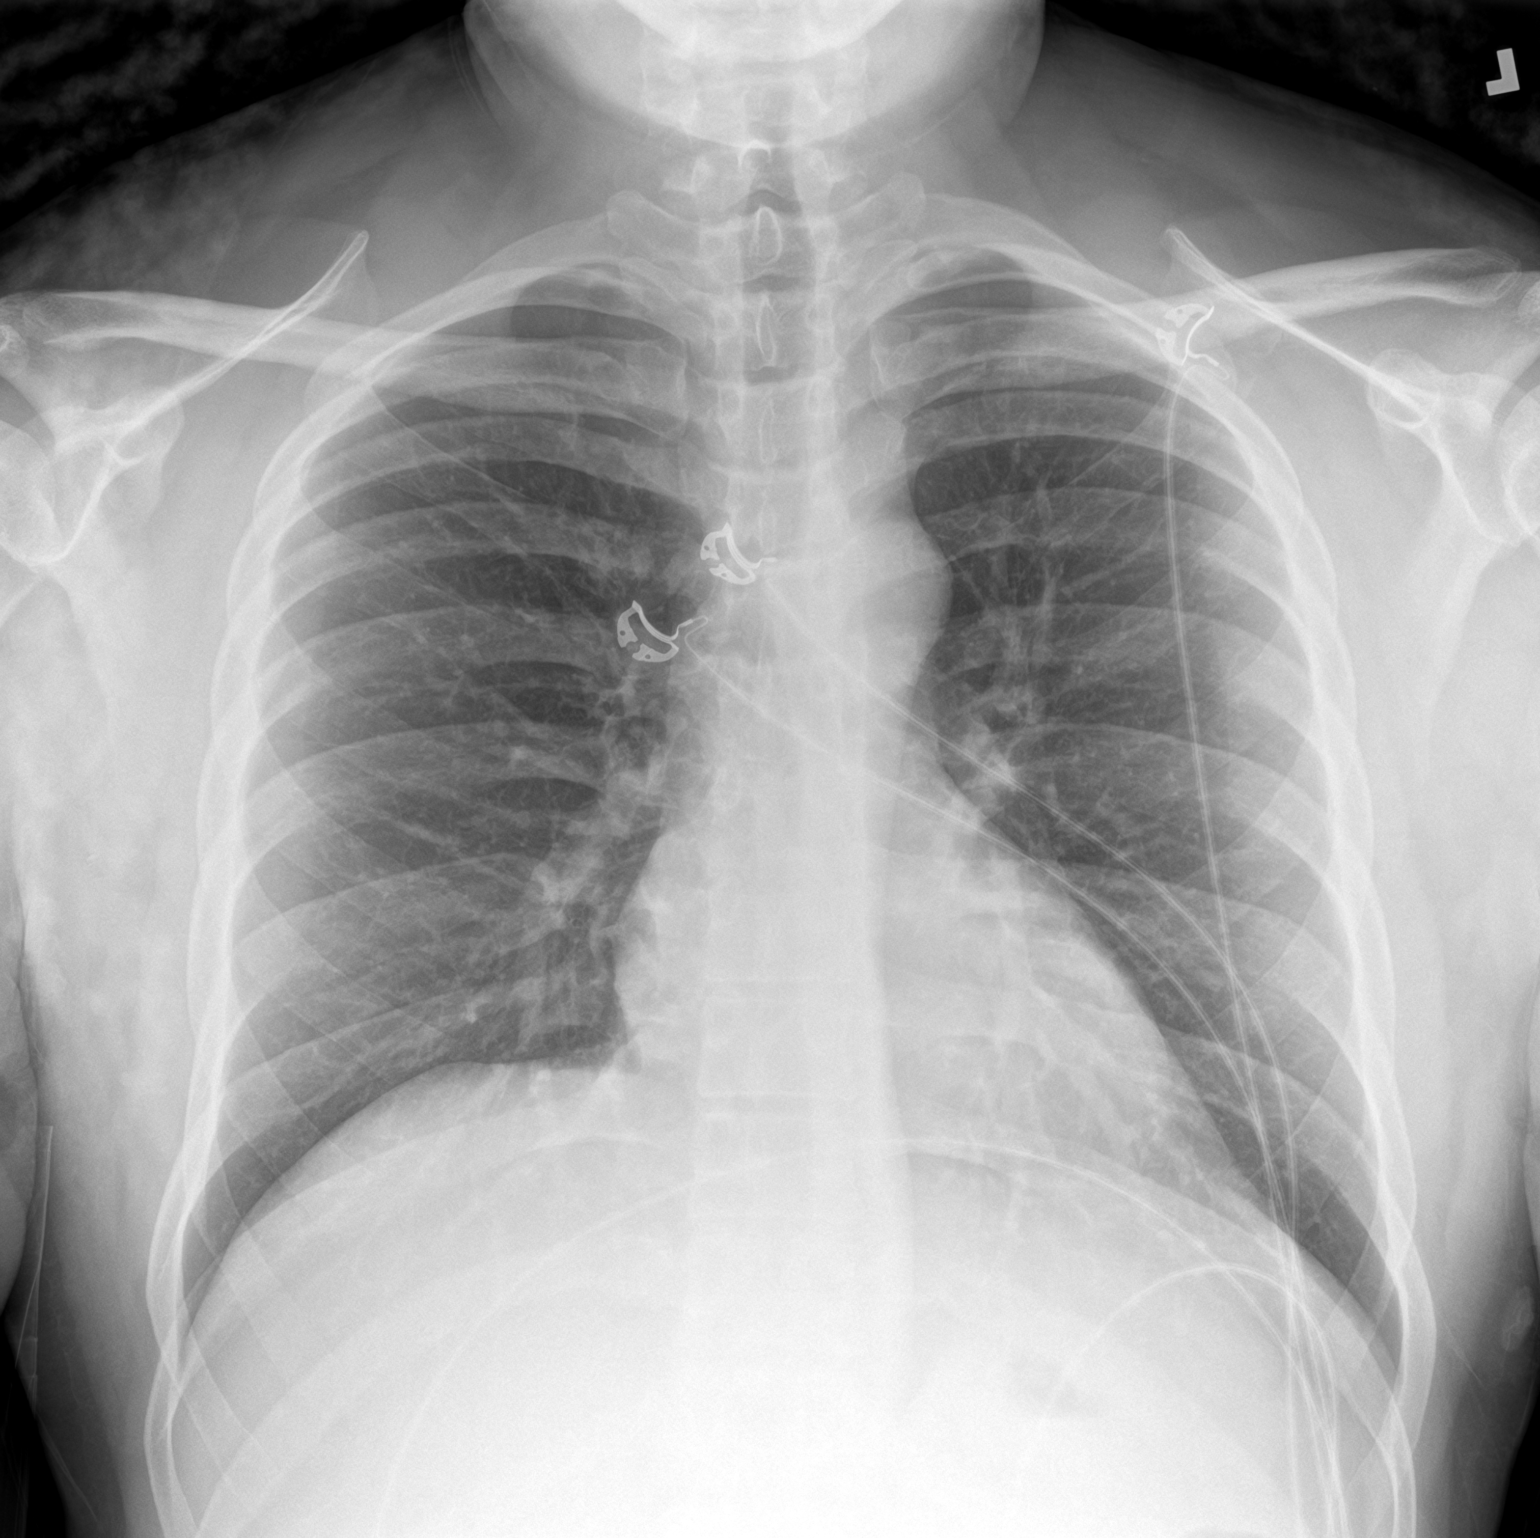

[chest lat]
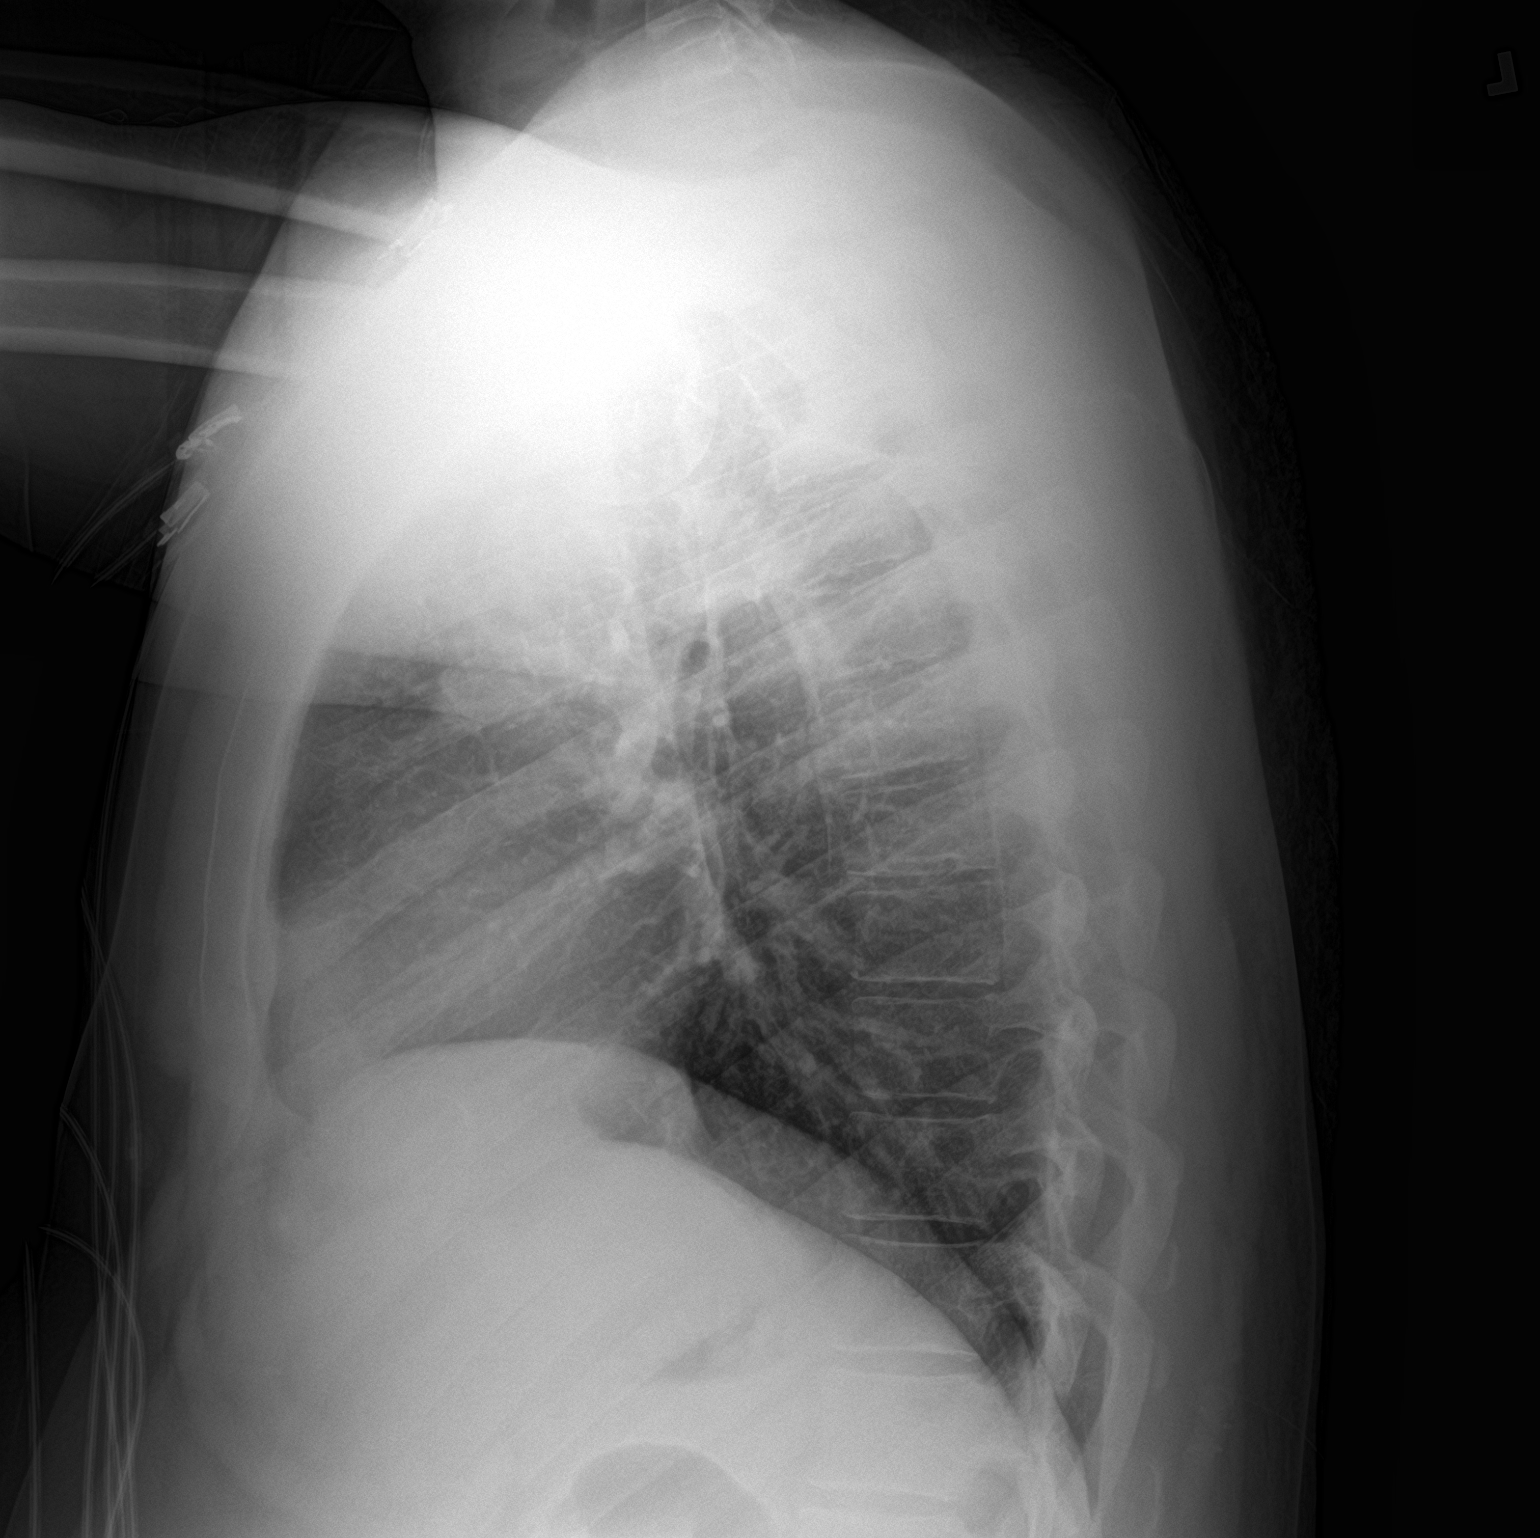

[2 of 2 positions shown; findings below may reference images not displayed]

FINDINGS: The cardiomediastinal contours are normal. The lungs are clear.
Pulmonary vasculature is normal. No consolidation, pleural effusion,
or pneumothorax. No acute osseous abnormalities are seen.
IMPRESSION: Negative radiographs of the chest.
